# Patient Record
Sex: Female | Born: 1975 | Race: White | Hispanic: No | Marital: Married | Smoking: Never smoker
Health system: Southern US, Community
[De-identification: ages and names within clinical notes are randomized; demographics above are authoritative.]

## PROBLEM LIST (undated history)

## (undated) DIAGNOSIS — I471 Supraventricular tachycardia: Secondary | ICD-10-CM

## (undated) DIAGNOSIS — K219 Gastro-esophageal reflux disease without esophagitis: Secondary | ICD-10-CM

## (undated) DIAGNOSIS — E119 Type 2 diabetes mellitus without complications: Secondary | ICD-10-CM

## (undated) DIAGNOSIS — I428 Other cardiomyopathies: Secondary | ICD-10-CM

## (undated) DIAGNOSIS — E7800 Pure hypercholesterolemia, unspecified: Secondary | ICD-10-CM

## (undated) HISTORY — DX: Other cardiomyopathies: I42.8

---
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
## 1996-12-29 HISTORY — PX: LAPAROSCOPIC CHOLECYSTECTOMY: SUR755

## 2001-04-05 ENCOUNTER — Other Ambulatory Visit: Admission: RE | Admit: 2001-04-05 | Discharge: 2001-04-05 | Payer: Self-pay | Admitting: *Deleted

## 2002-03-06 ENCOUNTER — Emergency Department (HOSPITAL_COMMUNITY): Admission: EM | Admit: 2002-03-06 | Discharge: 2002-03-06 | Payer: Self-pay | Admitting: Emergency Medicine

## 2002-03-06 ENCOUNTER — Encounter: Payer: Self-pay | Admitting: Emergency Medicine

## 2005-10-21 ENCOUNTER — Ambulatory Visit (HOSPITAL_COMMUNITY): Admission: RE | Admit: 2005-10-21 | Discharge: 2005-10-21 | Payer: Self-pay | Admitting: *Deleted

## 2005-12-29 HISTORY — PX: TUBAL LIGATION: SHX77

## 2006-01-22 ENCOUNTER — Ambulatory Visit (HOSPITAL_COMMUNITY): Admission: RE | Admit: 2006-01-22 | Discharge: 2006-01-22 | Payer: Self-pay | Admitting: *Deleted

## 2006-03-10 ENCOUNTER — Inpatient Hospital Stay (HOSPITAL_COMMUNITY): Admission: RE | Admit: 2006-03-10 | Discharge: 2006-03-12 | Payer: Self-pay | Admitting: *Deleted

## 2006-03-10 ENCOUNTER — Encounter (INDEPENDENT_AMBULATORY_CARE_PROVIDER_SITE_OTHER): Payer: Self-pay | Admitting: *Deleted

## 2009-01-18 ENCOUNTER — Ambulatory Visit (HOSPITAL_COMMUNITY): Admission: RE | Admit: 2009-01-18 | Discharge: 2009-01-18 | Payer: Self-pay | Admitting: Obstetrics and Gynecology

## 2009-01-18 ENCOUNTER — Other Ambulatory Visit: Admission: RE | Admit: 2009-01-18 | Discharge: 2009-01-18 | Payer: Self-pay | Admitting: Internal Medicine

## 2011-05-16 NOTE — Discharge Summary (Signed)
NAMECARLISLE, TORGESON NO.:  [id number]   MEDICAL RECORD NO.:  [id number]          PATIENT TYPE:  INP   LOCATION:  A413                          FACILITY:  [facility]   PHYSICIAN:  Langley Gauss, MD     DATE OF BIRTH:  09-21-1976   DATE OF ADMISSION:  03/10/2006  DATE OF DISCHARGE:  03/15/2007LH                                 DISCHARGE SUMMARY   PROCEDURES:  1.  Repeat cesarean section.  2.  Intraoperative tubal ligation.   DISPOSITION:  The patient is given a copy of standard discharge  instructions.  Circumcision performed.   FOLLOW UP:  She will follow up in the office in 4 days' time for staple  removal.   CONDITION ON DISCHARGE:  At the time of discharge, JP is removed from the  subcutaneous space as well as retention sutures from the incision.  The  patient is breast-feeding.   LABORATORY DATA AND X-RAY FINDINGS:  Hemoglobin and hematocrit 12.8 and  37.3.  Postop day #1 hemoglobin and hematocrit 11.6/34.4.   DISCHARGE MEDICATIONS:  Tylox #40 without refill.   HOSPITAL COURSE:  At outpatient surgical unit on March 10, 2006, nonstress  test is performed and interpreted in a.m. of March 10, 2006.  The patient  was then taken to the operating room utilizing spinal analgesic.  Repeat  cesarean section and tubal ligation performed without complications.  Postoperatively, the patient did well.  Foley catheter was removed on postop  day #1.  The patient subsequently was ambulatory and able to void without  difficulty.  The patient's vital signs remained stable and she remained  afebrile.  She was able to advance her diet and postop day #2, she is  passing flatus and doing well with p.o. medications.  Examination of the  incision reveals it to be well-approximated and healing very well.  The  patient subsequently now discharged to home on March 12, 2006.      Langley Gauss, MD  Electronically Signed     DC/MEDQ  D:  03/12/2006  T:  03/13/2006  Job:   [id number]

## 2011-05-16 NOTE — Op Note (Signed)
Margaret [name], [name] NO.:  [id number]   MEDICAL RECORD NO.:  [id number]          PATIENT TYPE:  INP   LOCATION:  A413                          FACILITY:  [facility]   PHYSICIAN:  Langley Gauss, MD     DATE OF BIRTH:  01-11-1976   DATE OF PROCEDURE:  03/11/2006  DATE OF DISCHARGE:                                 OPERATIVE REPORT   PREOPERATIVE DIAGNOSES:  1.  A 39-week intrauterine pregnancy, previous low transverse cesarean      section.  2.  Desires permanent sterilization.   POSTOPERATIVE DIAGNOSES:  1.  A 39-week intrauterine pregnancy, previous low transverse cesarean      section.  2.  Desires permanent sterilization.   PROCEDURE:  Repeat low transverse cesarean section intraoperative tubal  ligation utilizing modified Pomeroy technique utilized by Dr. Langley Gauss.   COMPLICATIONS:  None.   SPECIMENS:  Portions of right left fallopian tubes to pass.  Suture in the  left.  Arterial cord gas and cord blood to laboratory. Placenta examined and  noted be apparently intact with a 3-vessel umbilical cord.   ANALGESIA:  Spinal estimated blood loss 800 cc.   SUMMARY:  A nonstress test interpretation performed. The patient presented  to [facility] per protocol in the a.m. of March 10, 2006 on labor  delivery suite.  Nonstress test is performed. Fetal heart rate baseline  noted be 150, accelerations noted to be greater than 15 beats per minute x15  seconds duration. Normal long-term variability. No fetal heart rate  decelerations noted. No uterine activity. The patient is then taken the  operating room. Spinal analgesic administered without complications, placed  on the OR table with a slight left lateral tilt. Foley catheter sterilely  placed to straight drainage. The patient sterilely prepped and draped after  assurance of adequate surgical anesthesia. A knife is used to excise the  previous Pfannenstiel incision. Bleeders were cauterized along  the way  dissected out to the fascial plane utilizing cautery, cauterizing bleeders.  The fascia is then incised in a transverse curvilinear manner while sharply  dissecting off the underlying rectus muscle. The fascial edges were then  grasped using straight Kocher clamps. Fascia was dissected off the  underlying rectus muscle both superiorly and inferiorly utilizing the Mayo  scissors.   The rectus muscle is then bluntly separated. Peritoneal cavity is  atraumatically and bluntly entered at the superior most portion of the  incision. Peritoneal incision is extended superiorly and inferiorly.  Inferiorly the bladder was palpated to avoid its accidental injury. Bladder  blade is then placed. The lower segment is identified. A bladder flap is  created in the vesicouterine fold utilizing Metzenbaum scissors. Palpation  revealed vertex presentation with a nonengaged vertex.  A knife was then  used to score a low transverse uterine incision and intact amniotic sac was  encountered in the midline. The index fingers are used to bluntly extend the  low transverse uterine incision bilaterally. Infant is noted be vertex but  in LOT position. My right hand reached into the uterine cavity. The head  of  the infant is flexed and elevated to the level of the uterine incision.   The Silastic suction connected to wall suction is then placed on the  infant's vertex.  A small amount of fundal pressure, in combination with  gentle traction, resulted in very easy delivery of the vertex through the  uterine incision without difficulty. The remainder of the infant also  delivered atraumatically. Mouth and nares bulb suctioned of clear amniotic  fluid. Umbilical cord is milked towards the infant. The cord is doubly  clamped and cut; and infant is handed to waiting pediatrician, Dr. Vivia Ewing. Arterial cord gas and cord blood are then obtained. Gentle traction on  the umbilical cord results in separation of  what appears to be an intact 3-  vessel placenta with associated umbilical cord. Intrauterine exploration  reveals no retained placenta fragments.   The uterus was then exteriorized. Uterine incision is not extended. Tubes  and ovaries noted be normal in appearance. Minimal adhesive disease is noted  to be present in the posterior cul-de-sac the uterine incision is then  easily closed in 2 layers of #0 chromic in a running locked fashion. The  second layer being an imbricating layer. This restores a normal anatomy and  results in excellent hemostasis. Tubes are then examined inside. The right  fallopian tube is identified. It is grasped in its midportion utilizing  Babcock clamp.  It is then elevated, at which time two ties of #1 plain  suture are placed at the base of the loop of tube formed. A knuckle of right  fallopian tube is then dissected free and handed off as specimen #1.  The  pedicles are dried. The left fallopian tube is likewise identified, elevated  in its midportion, two ties of #1 plain suture are placed at the base of the  loop of tube formed. A portion of left fallopian tube is then dissected with  the Metzenbaum scissors. A single piece of suture is placed through the left  fallopian tube to identify it from the right. Both pedicles were noted to be  dry with excellent hemostasis. After irrigation of the pelvic cavity, the  uterus is now, again, returned to the peritoneal cavity. Sponge, needle, and  instrument counts are correct x2 at this point. Hemostasis is noted both  from the fallopian tube ligation as well as from the uterine incision. The  peritoneal edges were then grasped using Kelly clamps. Peritoneum and  overlying rectus muscle are closed with a continuous running #1 chromic  suture to reapproximate the normal anatomy. No bleeders are identified. A  final irrigation of the pelvic cavity is performed to assure hemostasis. The peritoneum is then completely  closed utilizing a #1 chromic suture.   No subfascial bleeders are noted. A double-stranded #1 PDS suture is used to  reapproximate the fascia. Subcutaneous bleeders are then cauterized. A JP  drain is placed in the subcutaneous space with a separate exit wound to left  apex of the incision. This is sutured into place. Additional examination is  performed with no additional subcutaneous bleeders identified. Three  horizontal mattress sutures and #1 PDS are then placed as retention-type  sutures. This makes it very easy to completely staple the skin utilizing  skin staples. A total of 30 mL of 0.5% bupivacaine plain is then injected  along the entirety of the incision to facilitate our postoperative  analgesia. The patient tolerated the procedure very well. Vital signs have  remained stable.  She is taken to recovery room in stable condition.  Delivered is a 7 pound 3 ounce female infant, reportedly doing well and  nursery.      Langley Gauss, MD  Electronically Signed     DC/MEDQ  D:  03/10/2006  T:  03/11/2006  Job:  [id number]

## 2011-05-16 NOTE — H&P (Signed)
NAMEGUSTAVIA, CARIE NO.:  [id number]   MEDICAL RECORD NO.:  [id number]          PATIENT TYPE:  INP   LOCATION:  A413                          FACILITY:  [facility]   PHYSICIAN:  Langley Gauss, MD     DATE OF BIRTH:  November 14, 1976   DATE OF ADMISSION:  03/10/2006  DATE OF DISCHARGE:  LH                                HISTORY & PHYSICAL   A 35 year old gravida 3, para 2 at 83 to [redacted] weeks gestation who is admitted  for repeat low transverse Cesarean section and bilateral tubal ligation. The  patient has one prior vaginal delivery and one prior Cesarean section for  indication of breech presentation. She will now deliver by repeat Cesarean  section as VBAC delivery is not an option at [facility]. In  addition, the patient desires permanent and irreversible sterilization. She  does understand there are reversible forms of birth control available. She  had been counseled and signed the Medicaid 30-day waiting papers during the  prenatal course. Prenatal course is noted to be uncomplicated. She had  normal AFP triple screen. The remainder of labs within normal limits. GBS-  carrier status negative.   PAST MEDICAL HISTORY:  1.  Appendectomy in 2001.  2.  Laparoscopic cholecystectomy at age 29.  3.  Vaginal delivery in 1996.  4.  Cesarean section in 1999.   ALLERGIES:  CODEINE and SULFA. Does fine with oxycodone.   CURRENT MEDICATIONS:  Prenatal vitamins.   PHYSICAL EXAMINATION:  VITAL SIGNS:  Height 5 foot 3. Prepregnancy 212, most  recent 244. Blood pressure is 118/74, pulse 80, respiratory rate 20.  HEENT:  Negative. No adenopathy.  NECK:  Supple. Thyroid is not palpable.  LUNGS:  Clear.  CARDIOVASCULAR:  Regular rate and rhythm.  ABDOMEN:  Soft and nontender. Previous Pfannenstiel incision is noted. She  has vertex presentation by Leopold's maneuvers.  EXTREMITIES:  Normal.  PELVIC:  Normal external genitalia. Cervix noted to be closed. Fundal height  is  38 cm. Fetal heart tones auscultated in the 150s.   ASSESSMENT/PLAN:  The patient to be admitted for planned repeat low  transverse Cesarean section and permanent sterilization during the same  operative procedure. Risks and benefits discussed with the patient to  include increased blood loss associated with cesarean delivery.      Langley Gauss, MD  Electronically Signed     DC/MEDQ  D:  03/12/2006  T:  03/12/2006  Job:  [id number]

## 2015-04-27 ENCOUNTER — Encounter (HOSPITAL_COMMUNITY): Payer: Self-pay | Admitting: Emergency Medicine

## 2015-04-27 ENCOUNTER — Emergency Department (HOSPITAL_COMMUNITY)
Admission: EM | Admit: 2015-04-27 | Discharge: 2015-04-27 | Disposition: A | Discharge status: Home / Self Care | Payer: BLUE CROSS/BLUE SHIELD | Attending: Emergency Medicine | Admitting: Emergency Medicine

## 2015-04-27 ENCOUNTER — Emergency Department (HOSPITAL_COMMUNITY): Payer: BLUE CROSS/BLUE SHIELD

## 2015-04-27 DIAGNOSIS — R0602 Shortness of breath: Secondary | ICD-10-CM | POA: Diagnosis not present

## 2015-04-27 DIAGNOSIS — I471 Supraventricular tachycardia, unspecified: Secondary | ICD-10-CM

## 2015-04-27 DIAGNOSIS — R000 Tachycardia, unspecified: Secondary | ICD-10-CM | POA: Diagnosis present

## 2015-04-27 DIAGNOSIS — Z8659 Personal history of other mental and behavioral disorders: Secondary | ICD-10-CM | POA: Insufficient documentation

## 2015-04-27 DIAGNOSIS — R002 Palpitations: Secondary | ICD-10-CM

## 2015-04-27 LAB — CBC WITH DIFFERENTIAL/PLATELET
BASOS PCT: 1 % (ref 0–1)
Basophils Absolute: 0 10*3/uL (ref 0.0–0.1)
EOS ABS: 0.1 10*3/uL (ref 0.0–0.7)
Eosinophils Relative: 2 % (ref 0–5)
HCT: 39.9 % (ref 36.0–46.0)
Hemoglobin: 12.9 g/dL (ref 12.0–15.0)
LYMPHS PCT: 25 % (ref 12–46)
Lymphs Abs: 2 10*3/uL (ref 0.7–4.0)
MCH: 28.9 pg (ref 26.0–34.0)
MCHC: 32.3 g/dL (ref 30.0–36.0)
MCV: 89.3 fL (ref 78.0–100.0)
MONO ABS: 0.5 10*3/uL (ref 0.1–1.0)
MONOS PCT: 6 % (ref 3–12)
NEUTROS PCT: 66 % (ref 43–77)
Neutro Abs: 5.6 10*3/uL (ref 1.7–7.7)
Platelets: 195 10*3/uL (ref 150–400)
RBC: 4.47 MIL/uL (ref 3.87–5.11)
RDW: 13.1 % (ref 11.5–15.5)
WBC: 8.2 10*3/uL (ref 4.0–10.5)

## 2015-04-27 LAB — COMPREHENSIVE METABOLIC PANEL
ALBUMIN: 4 g/dL (ref 3.5–5.2)
ALK PHOS: 100 U/L (ref 39–117)
ALT: 14 U/L (ref 0–35)
ANION GAP: 6 (ref 5–15)
AST: 14 U/L (ref 0–37)
BILIRUBIN TOTAL: 0.5 mg/dL (ref 0.3–1.2)
BUN: 11 mg/dL (ref 6–23)
CO2: 27 mmol/L (ref 19–32)
CREATININE: 0.52 mg/dL (ref 0.50–1.10)
Calcium: 9 mg/dL (ref 8.4–10.5)
Chloride: 104 mmol/L (ref 96–112)
GLUCOSE: 86 mg/dL (ref 70–99)
Potassium: 3.6 mmol/L (ref 3.5–5.1)
SODIUM: 137 mmol/L (ref 135–145)
Total Protein: 6.9 g/dL (ref 6.0–8.3)

## 2015-04-27 MED ORDER — SODIUM CHLORIDE 0.9 % IV BOLUS (SEPSIS)
1000.0000 mL | Freq: Once | INTRAVENOUS | Status: AC
  Administered 2015-04-27: 1000 mL via INTRAVENOUS

## 2015-04-27 MED ORDER — SODIUM CHLORIDE 0.9 % IV SOLN
INTRAVENOUS | Status: DC
  Administered 2015-04-27: 17:00:00 via INTRAVENOUS

## 2015-04-27 NOTE — Discharge Instructions (Signed)
Return for recurrent fast heart rate that last of 40 minutes or longer. Return for any recurrence of shortness of breath. Return for any development of chest pain. Make appointment to follow-up with cardiology.

## 2015-04-27 NOTE — ED Notes (Addendum)
EMS called for chest pain and SVT.  EMS HR 145 on arrival.  Hr 104 in truck.  VSS.  #20 left hand.

## 2015-04-27 NOTE — ED Notes (Signed)
IV site positional, IVF infusing but at moderate rate.  No infliltration noted.

## 2015-04-27 NOTE — ED Provider Notes (Signed)
CSN: [id number]     Arrival date & time 04/27/15  1409 History   First MD Initiated Contact with Patient 04/27/15 1426     Chief Complaint  Patient presents with  . Tachycardia     (Consider location/radiation/quality/duration/timing/severity/associated sxs/prior Treatment) The history is provided by the patient.   patient brought in by EMS for palpitations and probable supraventricular tachycardia with heart rate around 145 get as high as 166. Patient was at work felt some palpitations. 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 EMS eventually got involved and patient was brought here. Patient was originally taken to urgent care by her husband and then EMS called from the urgent care. Associated with some mild mild shortness of breath no chest pain at all. Patient's had these feelings of fast heart rate in the past thought was due to anxiety. Never had a workup for. No syncope. No fevers. No nausea no vomiting. No significant pain at all. Patient was not given any medications by EMS on the way in. They started an IV.  History reviewed. No pertinent past medical history. Past Surgical History  Procedure Laterality Date  . Appendectomy    . Cholecystectomy    . Cesarean section      X2   Family History  Problem Relation Age of Onset  . Hypertension Mother   . Heart failure Father   . Hypertension Father   . Stroke Father    History  Substance Use Topics  . Smoking status: Never Smoker   . Smokeless tobacco: Not on file  . Alcohol Use: No   OB History    Gravida Para Term Preterm AB TAB SAB Ectopic Multiple Living   2 2 2             Review of Systems  Constitutional: Negative for fever and fatigue.  HENT: Negative for congestion.   Eyes: Negative for visual disturbance.  Respiratory: Positive for shortness of breath. Negative for cough and wheezing.   Cardiovascular: Positive for palpitations. Negative for chest pain and leg swelling.  Gastrointestinal:  Negative for nausea, vomiting and abdominal pain.  Genitourinary: Negative for dysuria.  Musculoskeletal: Negative for back pain.  Skin: Negative for rash.  Neurological: Negative for headaches.  Hematological: Does not bruise/bleed easily.  Psychiatric/Behavioral: Negative for confusion.      Allergies  Codeine and Sulfa antibiotics  Home Medications   Prior to Admission medications   Not on File   BP 110/62 mmHg  Pulse 97  Temp(Src) 98.3 F (36.8 C)  Resp 17  Ht 5\' 4"  (1.626 m)  Wt 201 lb (91.173 kg)  BMI 34.48 kg/m2  SpO2 100%  LMP 04/03/2015 Physical Exam  Constitutional: She is oriented to person, place, and time. She appears well-developed and well-nourished. No distress.  HENT:  Head: Normocephalic and atraumatic.  Eyes: Conjunctivae and EOM are normal. Pupils are equal, round, and reactive to light.  Neck: Normal range of motion.  Cardiovascular: Normal rate, regular rhythm and normal heart sounds.   No murmur heard. Pulmonary/Chest: Effort normal and breath sounds normal. No respiratory distress. She has no wheezes. She has no rales. She exhibits no tenderness.  Abdominal: Soft. Bowel sounds are normal. There is no tenderness.  Musculoskeletal: Normal range of motion. She exhibits no edema or tenderness.  Neurological: She is alert and oriented to person, place, and time. No cranial nerve deficit. She exhibits normal muscle tone. Coordination normal.  Skin: Skin is warm. No rash noted.  Nursing note and vitals reviewed.   ED Course  Procedures (including critical care time) Labs Review Labs Reviewed  COMPREHENSIVE METABOLIC PANEL  CBC WITH DIFFERENTIAL/PLATELET    Imaging Review Dg Chest 2 View  04/27/2015   CLINICAL DATA:  Cardiac palpitations  EXAM: CHEST  2 VIEW  COMPARISON:  None.  FINDINGS: Lungs are clear. Heart size and pulmonary vascularity are normal. No adenopathy. There is mild upper thoracic levoscoliosis.  IMPRESSION: No edema or  consolidation.   Electronically Signed   By: Lowella Grip III M.D.   On: 04/27/2015 15:13     EKG Interpretation   Date/Time:  Friday April 27 2015 14:10:27 EDT Ventricular Rate:  102 PR Interval:  121 QRS Duration: 93 QT Interval:  355 QTC Calculation: 462 R Axis:   11 Text Interpretation:  Sinus tachycardia Abnormal R-wave progression, early  transition Left ventricular hypertrophy Confirmed by Rogene Houston  MD, Nick Armel  [phone]) on 04/27/2015 2:19:56 PM      MDM   Final diagnoses:  Heart palpitations  SVT (supraventricular tachycardia)   Results for orders placed or performed during the hospital encounter of 04/27/15  Comprehensive metabolic panel  Result Value Ref Range   Sodium 137 135 - 145 mmol/L   Potassium 3.6 3.5 - 5.1 mmol/L   Chloride 104 96 - 112 mmol/L   CO2 27 19 - 32 mmol/L   Glucose, Bld 86 70 - 99 mg/dL   BUN 11 6 - 23 mg/dL   Creatinine, Ser 0.52 0.50 - 1.10 mg/dL   Calcium 9.0 8.4 - 10.5 mg/dL   Total Protein 6.9 6.0 - 8.3 g/dL   Albumin 4.0 3.5 - 5.2 g/dL   AST 14 0 - 37 U/L   ALT 14 0 - 35 U/L   Alkaline Phosphatase 100 39 - 117 U/L   Total Bilirubin 0.5 0.3 - 1.2 mg/dL   GFR calc non Af Amer >90 >90 mL/min   GFR calc Af Amer >90 >90 mL/min   Anion gap 6 5 - 15  CBC with Differential/Platelet  Result Value Ref Range   WBC 8.2 4.0 - 10.5 K/uL   RBC 4.47 3.87 - 5.11 MIL/uL   Hemoglobin 12.9 12.0 - 15.0 g/dL   HCT 39.9 36.0 - 46.0 %   MCV 89.3 78.0 - 100.0 fL   MCH 28.9 26.0 - 34.0 pg   MCHC 32.3 30.0 - 36.0 g/dL   RDW 13.1 11.5 - 15.5 %   Platelets 195 150 - 400 K/uL   Neutrophils Relative % 66 43 - 77 %   Neutro Abs 5.6 1.7 - 7.7 K/uL   Lymphocytes Relative 25 12 - 46 %   Lymphs Abs 2.0 0.7 - 4.0 K/uL   Monocytes Relative 6 3 - 12 %   Monocytes Absolute 0.5 0.1 - 1.0 K/uL   Eosinophils Relative 2 0 - 5 %   Eosinophils Absolute 0.1 0.0 - 0.7 K/uL   Basophils Relative 1 0 - 1 %   Basophils Absolute 0.0 0.0 - 0.1 K/uL     Patient  around 1045 noted palpitations. Was seen by the nurse at work noted that she had a fast heart rate.  As high as 166. Patient's had similar feelings in the past and thought that they were related to anxiety. EMS brought patient in no medication provided. Heart rate upon arrival was slightly over 100 peers associated with some mild shortness of breath no severe shortness of breath no chest pain.  Patient has not  had a workup for these palpitations in the past. Workup here today no acute findings on the EKG other than a sinus tachycardia with a heart rate of 102. Now patient without any formal treatment heart rate is in the 80s. Blood pressures been stable. Chest x-rays negative. Labs without significant abnormalities.  Patient's room air sats here then 95% or greater. Clinically no concern currently for pulmonary embolus. Silva Bandy this was just supraventricular tachycardia.  Patient stable for discharge home with precautions and follow-up with cardiology.   Fredia Sorrow, MD 04/27/15 [phone]

## 2015-05-14 ENCOUNTER — Ambulatory Visit (INDEPENDENT_AMBULATORY_CARE_PROVIDER_SITE_OTHER): Payer: BLUE CROSS/BLUE SHIELD | Admitting: Cardiology

## 2015-05-14 ENCOUNTER — Encounter: Payer: Self-pay | Admitting: Cardiology

## 2015-05-14 ENCOUNTER — Encounter: Payer: BLUE CROSS/BLUE SHIELD | Admitting: Adult Health

## 2015-05-14 VITALS — BP 132/86 | HR 90 | Ht 63.0 in | Wt 202.0 lb

## 2015-05-14 DIAGNOSIS — I471 Supraventricular tachycardia: Secondary | ICD-10-CM | POA: Diagnosis not present

## 2015-05-14 MED ORDER — METOPROLOL TARTRATE 25 MG PO TABS: 25.0000 mg | ORAL_TABLET | Freq: Two times a day (BID) | ORAL | Status: DC

## 2015-05-14 NOTE — Progress Notes (Signed)
     Clinical Summary Ms. Brathwaite is a 39 y.o.female seen today for follow up of the following medical problems.  1. Tachycardia - intermittent episodes of palpitations for 10 years - seen in ER 04/07/15 with palpitations, elevated heart rates at work to 150s. EMS strips show SVT rate 140-150s. - K 3.6, Cr 0.52, Hgb 12.9, CXR no acute proces - no coffee, pepsis x 3 cans, no tea, no energy drinks, no EtOH. No OTC supplements    No past medical history on file.   Allergies  Allergen Reactions  . Codeine Hives and Itching  . Sulfa Antibiotics Hives     No current outpatient prescriptions on file.   No current facility-administered medications for this visit.     Past Surgical History  Procedure Laterality Date  . Appendectomy    . Cholecystectomy    . Cesarean section      X2     Allergies  Allergen Reactions  . Codeine Hives and Itching  . Sulfa Antibiotics Hives      Family History  Problem Relation Age of Onset  . Hypertension Mother   . Heart failure Father   . Hypertension Father   . Stroke Father      Social History Ms. Flannery reports that she has never smoked. She does not have any smokeless tobacco history on file. Ms. Sawin reports that she does not drink alcohol.   Review of Systems CONSTITUTIONAL: No weight loss, fever, chills, weakness or fatigue.  HEENT: Eyes: No visual loss, blurred vision, double vision or yellow sclerae.No hearing loss, sneezing, congestion, runny nose or sore throat.  SKIN: No rash or itching.  CARDIOVASCULAR: per HPI RESPIRATORY: No shortness of breath, cough or sputum.  GASTROINTESTINAL: No anorexia, nausea, vomiting or diarrhea. No abdominal pain or blood.  GENITOURINARY: No burning on urination, no polyuria NEUROLOGICAL: No headache, dizziness, syncope, paralysis, ataxia, numbness or tingling in the extremities. No change in bowel or bladder control.  MUSCULOSKELETAL: No muscle, back pain, joint pain or  stiffness.  LYMPHATICS: No enlarged nodes. No history of splenectomy.  PSYCHIATRIC: No history of depression or anxiety.  ENDOCRINOLOGIC: No reports of sweating, cold or heat intolerance. No polyuria or polydipsia.  Marland Kitchen   Physical Examination Filed Vitals:   05/14/15 1316  BP: 132/86  Pulse: 90   Filed Vitals:   05/14/15 1316  Height: 5\' 3"  (1.6 m)  Weight: 202 lb (91.627 kg)    Gen: resting comfortably, no acute distress HEENT: no scleral icterus, pupils equal round and reactive, no palptable cervical adenopathy,  CV: RRR, no m/r/g, no JVD, no carotid bruits Resp: Clear to auscultation bilaterally GI: abdomen is soft, non-tender, non-distended, normal bowel sounds, no hepatosplenomegaly MSK: extremities are warm, no edema.  Skin: warm, no rash Neuro:  no focal deficits Psych: appropriate affect     Assessment and Plan  1. PSVT - will check Mg and TSH as these were not checked in ER - counseled to limit caffeine intake - will start lopressor 25mg  bid      Arnoldo Lenis, M.D.

## 2015-05-14 NOTE — Patient Instructions (Signed)
Your physician wants you to follow-up in: 6 months with Dr Bryna Colander will receive a reminder letter in the mail two months in advance. If you don't receive a letter, please call our office to schedule the follow-up appointment.     START Lopressor 25 mg twice a day    Please get lab work : BMET,TSH,Magnesium     Thank you for choosing [facility] !

## 2015-05-14 NOTE — Progress Notes (Signed)
Cardiology Office Note   ERROR NO SHOW 

## 2015-05-15 LAB — MAGNESIUM: Magnesium: 2 mg/dL (ref 1.5–2.5)

## 2015-05-15 LAB — BASIC METABOLIC PANEL
BUN: 10 mg/dL (ref 6–23)
CO2: 28 mEq/L (ref 19–32)
Calcium: 9.2 mg/dL (ref 8.4–10.5)
Chloride: 103 mEq/L (ref 96–112)
Creat: 0.7 mg/dL (ref 0.50–1.10)
Glucose, Bld: 101 mg/dL — ABNORMAL HIGH (ref 70–99)
POTASSIUM: 3.5 meq/L (ref 3.5–5.3)
Sodium: 139 mEq/L (ref 135–145)

## 2015-05-15 LAB — TSH: TSH: 0.773 u[IU]/mL (ref 0.350–4.500)

## 2015-06-06 ENCOUNTER — Telehealth: Payer: Self-pay | Admitting: Cardiology

## 2015-06-06 NOTE — Telephone Encounter (Signed)
Patient would like to speak with nurse regarding medication concerns. / tg  °

## 2015-06-06 NOTE — Telephone Encounter (Signed)
Patient states that she at 8:30 am this morning she started to feel dizzy, SOB and have a cold sweats on and off. Patient denies CP at this time. Patient encouraged to be seen in the ED today.

## 2015-06-29 ENCOUNTER — Ambulatory Visit: Payer: BLUE CROSS/BLUE SHIELD | Admitting: Cardiology

## 2015-10-23 ENCOUNTER — Emergency Department (HOSPITAL_COMMUNITY)
Admission: EM | Admit: 2015-10-23 | Discharge: 2015-10-23 | Disposition: A | Discharge status: Home / Self Care | Payer: BLUE CROSS/BLUE SHIELD | Attending: Emergency Medicine | Admitting: Emergency Medicine

## 2015-10-23 ENCOUNTER — Encounter (HOSPITAL_COMMUNITY): Payer: Self-pay | Admitting: *Deleted

## 2015-10-23 DIAGNOSIS — Z79899 Other long term (current) drug therapy: Secondary | ICD-10-CM | POA: Diagnosis not present

## 2015-10-23 DIAGNOSIS — R0602 Shortness of breath: Secondary | ICD-10-CM | POA: Diagnosis present

## 2015-10-23 DIAGNOSIS — I471 Supraventricular tachycardia: Secondary | ICD-10-CM | POA: Diagnosis not present

## 2015-10-23 HISTORY — DX: Supraventricular tachycardia: I47.1

## 2015-10-23 MED ORDER — OXYCODONE-ACETAMINOPHEN 5-325 MG PO TABS: 2.0000 | ORAL_TABLET | ORAL | Status: DC | PRN

## 2015-10-23 MED ORDER — IBUPROFEN 800 MG PO TABS: 800.0000 mg | ORAL_TABLET | Freq: Three times a day (TID) | ORAL | Status: DC

## 2015-10-23 MED ORDER — ADENOSINE 6 MG/2ML IV SOLN
6.0000 mg | Freq: Once | INTRAVENOUS | Status: AC
  Administered 2015-10-23: 6 mg via INTRAVENOUS

## 2015-10-23 MED ORDER — ADENOSINE 6 MG/2ML IV SOLN
INTRAVENOUS | Status: AC
  Administered 2015-10-23: 6 mg via INTRAVENOUS
  Filled 2015-10-23: qty 6

## 2015-10-23 MED ORDER — ONDANSETRON 4 MG PO TBDP: 4.0000 mg | ORAL_TABLET | Freq: Three times a day (TID) | ORAL | Status: DC | PRN

## 2015-10-23 MED ORDER — METOPROLOL TARTRATE 1 MG/ML IV SOLN
5.0000 mg | Freq: Once | INTRAVENOUS | Status: AC
  Administered 2015-10-23: 5 mg via INTRAVENOUS

## 2015-10-23 MED ORDER — METOPROLOL TARTRATE 1 MG/ML IV SOLN
INTRAVENOUS | Status: AC
  Filled 2015-10-23: qty 5

## 2015-10-23 NOTE — ED Notes (Addendum)
Pt was brought in by [facility] from pt's work. EMS reports pt is in asymptomatic SVT. Pt was c/o SOB. BP 112/86, HR 162, O2 sat 99% RA. Pt has hx of SVT in May and was placed on Lopressor. EMS reports Fire attempted vagal maneuvers with pt with no help. Pt reports she started feeling SOB and her "heart pounding" about 0830 this morning at work and went to see nurse at work. Pt denies chest pain, nausea, vomiting, dizziness, lightheadedness.

## 2015-10-23 NOTE — ED Notes (Signed)
MD at bedside. 

## 2015-10-23 NOTE — ED Provider Notes (Addendum)
CSN: [id number]     Arrival date & time 10/23/15  1100 History  By signing my name below, I, Emmanuella Mensah, attest that this documentation has been prepared under the direction and in the presence of Tanna Furry, MD. Electronically Signed: Judithann Sauger, ED Scribe. 10/23/2015. 11:27 AM.     Chief Complaint  Patient presents with  . Tachycardia  . Shortness of Breath    The history is provided by the patient. No language interpreter was used.   HPI Comments: Margaret [name] is a 39 y.o. female with a hx of SVT who presents to the Emergency Department complaining of gradually worsening tachycardia and SOB onset 3 hours ago. She explains that her HR was 160 went the nurse checked it at work. Pt is currently on Lopressor twice a day and her last dose was this am. She reports that she was diagnosed with SVT in April and has only had mild episodes between then and now. She states that she has not been compliant with her no caffeine diet and she had about 1/4 of a can of Pepsi PTA. She denies an hx of smoking, drinking, or drug use.   Past Medical History  Diagnosis Date  . SVT (supraventricular tachycardia) [facility])    Past Surgical History  Procedure Laterality Date  . Appendectomy    . Cholecystectomy    . Cesarean section      X2   Family History  Problem Relation Age of Onset  . Hypertension Mother   . Heart failure Father   . Hypertension Father   . Stroke Father    Social History  Substance Use Topics  . Smoking status: Never Smoker   . Smokeless tobacco: None  . Alcohol Use: No   OB History    Gravida Para Term Preterm AB TAB SAB Ectopic Multiple Living   2 2 2             Review of Systems  Constitutional: Negative for fever, chills, diaphoresis, appetite change and fatigue.  HENT: Negative for mouth sores, sore throat and trouble swallowing.   Eyes: Negative for visual disturbance.  Respiratory: Positive for shortness of breath. Negative for cough, chest  tightness and wheezing.   Cardiovascular: Negative for chest pain.       Tachycardia  Gastrointestinal: Negative for nausea, vomiting, abdominal pain, diarrhea and abdominal distention.  Endocrine: Negative for polydipsia, polyphagia and polyuria.  Genitourinary: Negative for dysuria, frequency and hematuria.  Musculoskeletal: Negative for gait problem.  Skin: Negative for color change, pallor and rash.  Neurological: Negative for dizziness, syncope, light-headedness and headaches.  Hematological: Does not bruise/bleed easily.  Psychiatric/Behavioral: Negative for behavioral problems and confusion.      Allergies  Codeine and Sulfa antibiotics  Home Medications   Prior to Admission medications   Medication Sig Start Date End Date Taking? Authorizing Provider  metoprolol tartrate (LOPRESSOR) 25 MG tablet Take 1 tablet (25 mg total) by mouth 2 (two) times daily. 05/14/15  Yes Arnoldo Lenis, MD   BP 119/74 mmHg  Pulse 81  Temp(Src) 97.5 F (36.4 C) (Oral)  Resp 16  Ht 5\' 4"  (1.626 m)  Wt 218 lb (98.884 kg)  BMI 37.40 kg/m2  SpO2 97%  LMP 10/09/2015 (Exact Date) Physical Exam  Constitutional: She is oriented to person, place, and time. She appears well-developed and well-nourished. No distress.  HENT:  Head: Normocephalic.  Eyes: Conjunctivae are normal. Pupils are equal, round, and reactive to light. No scleral icterus.  Neck: Normal range of motion. Neck supple. No thyromegaly present.  Cardiovascular: Regular rhythm.  Exam reveals no gallop and no friction rub.   No murmur heard. Tachycardia  Pulmonary/Chest: Effort normal and breath sounds normal. No respiratory distress. She has no wheezes. She has no rales.  Abdominal: Soft. Bowel sounds are normal. She exhibits no distension. There is no tenderness. There is no rebound.  Musculoskeletal: Normal range of motion.  Neurological: She is alert and oriented to person, place, and time.  Skin: Skin is warm and dry. No  rash noted.  Psychiatric: She has a normal mood and affect. Her behavior is normal.  Nursing note and vitals reviewed.   ED Course  .Cardioversion Date/Time: 10/23/2015 11:20 AM Performed by: Tanna Furry Authorized by: Tanna Furry Consent: Verbal consent obtained. Risks and benefits: risks, benefits and alternatives were discussed Consent given by: patient Patient understanding: patient states understanding of the procedure being performed Patient identity confirmed: verbally with patient Time out: Immediately prior to procedure a "time out" was called to verify the correct patient, procedure, equipment, support staff and site/side marked as required. Patient sedated: no Cardioversion basis: emergent Pre-procedure rhythm: supraventricular tachycardia Number of attempts: 1   Patient given Identicard 6 mg IV push with immediate conversion to sinus rhythm.   (including critical care time) DIAGNOSTIC STUDIES: Oxygen Saturation is 99% on RA, normal by my interpretation.    COORDINATION OF CARE: 11:24 AM- Pt advised of plan for treatment and pt agrees. Pt will be monitored and given adenosine to evaluate possible SVT.    Labs Review Labs Reviewed - No data to display  Imaging Review No results found. No att. providers found has personally reviewed and evaluated these images and lab results as part of his medical decision-making.   EKG Interpretation   Date/Time:  Tuesday October 23 2015 11:12:44 EDT Ventricular Rate:  134 PR Interval:    QRS Duration: 91 QT Interval:  316 QTC Calculation: 472 R Axis:   24 Text Interpretation:  PSVT. --Converts to NSR with Adenosine 6mg  IVP  Confirmed by Jeneen Rinks  MD, [place] ([id number]) on 10/23/2015 3:26:42 PM      MDM   Final diagnoses:  Supraventricular tachycardia ([place])    Patient underwent chemical cardioversion from SVT to sinus rhythm with Identicard 6 mg. Given Lopressor 5 mg. Maintain sinus rhythm. Plan is home, absolute abstinence  from caffeine, cardiology follow up Dr. branch to discuss further care possibility of procedures or ablation. ER with recurrence.  I personally performed the services described in this documentation, which was scribed in my presence. The recorded information has been reviewed and is accurate.   Tanna Furry, MD 10/23/15 [place], MD 10/23/15 [phone]

## 2015-10-23 NOTE — Discharge Instructions (Signed)
Stop caffeine. Call Dr. Harl Bowie for recheck. Return to ER with recurrence   Paroxysmal Supraventricular Tachycardia Paroxysmal supraventricular tachycardia (PSVT) is a type of abnormal heart rhythm. It causes your heart to beat very quickly and then suddenly stop beating so quickly. A normal heart rate is 60-100 beats per minute. During an episode of PSVT, your heart rate may be 150-250 beats per minute. This can make you feel light-headed and short of breath. An episode of PSVT can be frightening. It is usually not dangerous. The heart has four chambers. All chambers need to work together for the heart to beat effectively. A normal heartbeat usually starts in the right upper chamber of the heart (atrium) when an area (sinoatrial node) puts out an electrical signal that spreads to the other chambers. People with PSVT may have abnormal electrical pathways, or they may have other areas in the upper chambers that send out electrical signals. The result is a very rapid heartbeat. When your heart beats very quickly, it does not have time to fill completely with blood. When PSVT happens often or it lasts for long periods, it can lead to heart weakness and failure. Most people with PSVT do not have any other heart disease. CAUSES Abnormal electrical activity in the heart causes PSVT. It is not known why some people get PSVT and others do not. RISK FACTORS You may be more likely to have PSVT if:  You are 75-59 years old.  You are a woman. Other factors that may increase your chances of an attack include:  Stress.  Being tired.  Smoking.  Stimulant drugs.  Alcoholic drinks.  Caffeine.  Pregnancy. SIGNS AND SYMPTOMS A mild episode of PSVT may cause no symptoms. If you do have signs and symptoms, they may include:  A pounding heart.  Feeling of skipped heartbeats (palpitations).  Weakness.  Shortness of breath.  Tightness or pain in your  chest.  Light-headedness.  Anxiety.  Dizziness.  Sweating.  Nausea.  A fainting spell. DIAGNOSIS Your health care provider may suspect PSVT if you have symptoms that come and go. The health care provider will do a physical exam. If you are having an episode during the exam, the health care provider may be able to diagnose PSVT by listening to your heart and feeling your pulse. Tests may also be done, including:  An electrical study of your heart (electrocardiogram, or ECG).  A test in which you wear a portable ECG monitor all day (Holter monitor) or for several days (event monitor).  A test that involves taking an image of your heart using sound waves (echocardiogram) to rule out other causes of a fast heart rate. TREATMENT You may not need treatment if episodes of PSVT do not happen often or if they do not cause symptoms. If PSVT episodes do cause symptoms, your health care provider may first suggest trying a self-treatment called vagus nerve stimulation. The vagus nerve extends down from the brain. It regulates certain body functions. Stimulating this nerve can slow down the heart. Your health care provider can teach you ways to do this. You may need to try a few ways to find what works best for you. Options include:  Holding your breath and pushing, as though you are having a bowel movement.  Massaging an area on one side of your neck below your jaw.  Bending forward with your head between your legs.  Bending forward with your head between your legs and coughing.  Massaging your eyeballs with your  eyes closed. If vagus nerve stimulation does not work, other treatment options include:  Medicines to prevent an attack.  Being treated in the hospital with medicine or electric shock to stop an attack (cardioversion). This treatment can include:  Getting medicine through an IV line.  Having a small electric shock delivered to your heart. You will be given medicine to make you  sleep through this procedure.  If you have frequent episodes with symptoms, you may need a procedure to get rid of the faulty areas of your heart (radiofrequency ablation) and end the episodes of PSVT. In this procedure:  A long, thin tube (catheter) is passed through one of your veins into your heart.  Energy directed through the catheter eliminates the areas of your heart that are causing abnormal electric stimulation. HOME CARE INSTRUCTIONS  Take medicines only as directed by your health care provider.  Do not use caffeine in any form if caffeine triggers episodes of PSVT. Otherwise, consume caffeine in moderation. This means no more than a few cups of coffee or the equivalent each day.  Do not drink alcohol if alcohol triggers episodes of PSVT. Otherwise, limit alcohol intake to no more than 1 drink per day for nonpregnant women and 2 drinks per day for men. One drink equals 12 ounces of beer, 5 ounces of wine, or 1 ounces of hard liquor.  Do not use any tobacco products, including cigarettes, chewing tobacco, or electronic cigarettes. If you need help quitting, ask your health care provider.  Try to get at least 7 hours of sleep each night.  Find healthy ways to manage stress.  Perform vagus nerve stimulation as directed by your health care provider.  Maintain a healthy weight.  Get some exercise on most days. Ask your health care provider to suggest some good activities for you. SEEK MEDICAL CARE IF:  You are having episodes of PSVT more often, or they are lasting longer.  Vagus nerve stimulation is no longer helping.  You have new symptoms during an episode. SEEK IMMEDIATE MEDICAL CARE IF:  You have chest pain or trouble breathing.  You have an episode of PSVT that has lasted longer than 20 minutes.  You have passed out from an episode of PSVT. These symptoms may represent a serious problem that is an emergency. Do not wait to see if the symptoms will go away. Get  medical help right away. Call your local emergency services (911 in the [place] Do not drive yourself to the hospital.   This information is not intended to replace advice given to you by your health care provider. Make sure you discuss any questions you have with your health care provider.   Document Released: 12/15/2005 Document Revised: 01/05/2015 Document Reviewed: 05/25/2014 Elsevier Interactive Patient Education [employer].

## 2015-10-24 ENCOUNTER — Telehealth: Payer: Self-pay | Admitting: Cardiology

## 2015-10-24 NOTE — Telephone Encounter (Signed)
Would like to know if she needs to be seen sooner than her November appt.  She stated that she was in ED at [facility] 10/23/15

## 2015-10-24 NOTE — Telephone Encounter (Signed)
Patient states she was seen for SVT and SOB  at [facility]. Will forward to Dr. Harl Bowie

## 2015-10-25 MED ORDER — METOPROLOL TARTRATE 25 MG PO TABS: 37.5000 mg | ORAL_TABLET | Freq: Two times a day (BID) | ORAL | Status: DC

## 2015-10-25 NOTE — Telephone Encounter (Signed)
Called pt back to let her know about medication change & to keep her scheduled appointment with Dr. Harl Bowie.

## 2015-10-25 NOTE — Telephone Encounter (Signed)
Spoke to pt- Let her know to increase lopressor to 37.5 mg twice daily. Keep same apt.

## 2015-10-25 NOTE — Telephone Encounter (Signed)
ER notes reviewed. I would increase her lopressor to 37.5mg  bid and keep same appointment unless continued symptoms.   Zandra Abts MD

## 2015-11-13 ENCOUNTER — Encounter: Payer: Self-pay | Admitting: Cardiology

## 2015-11-13 ENCOUNTER — Ambulatory Visit (INDEPENDENT_AMBULATORY_CARE_PROVIDER_SITE_OTHER): Payer: BLUE CROSS/BLUE SHIELD | Admitting: Cardiology

## 2015-11-13 VITALS — BP 120/78 | HR 80 | Ht 63.0 in | Wt 225.0 lb

## 2015-11-13 DIAGNOSIS — I471 Supraventricular tachycardia, unspecified: Secondary | ICD-10-CM

## 2015-11-13 DIAGNOSIS — R002 Palpitations: Secondary | ICD-10-CM

## 2015-11-13 MED ORDER — METOPROLOL TARTRATE 50 MG PO TABS: 50.0000 mg | ORAL_TABLET | Freq: Two times a day (BID) | ORAL | Status: DC

## 2015-11-13 NOTE — Patient Instructions (Signed)
Medication Instructions:  Increase lopressor to 50 mg TWICE DAILY  Labwork: NONE  Testing/Procedures: NONE  Follow-Up: Your physician recommends that you schedule a follow-up appointment in:3 MONTHS WITH DR. BRANCH  Any Other Special Instructions Will Be Listed Below (If Applicable).     If you need a refill on your cardiac medications before your next appointment, please call your pharmacy.  Thanks for choosing [facility]!!!

## 2015-11-13 NOTE — Progress Notes (Signed)
Patient ID: Margaret [name], female   DOB: 16-Jan-1976, 39 y.o.   MRN: [id number]     Clinical Summary Ms. Margaret [name] is a 39 y.o.female seen today for follow up of the following medical problems.   1. PSVT - intermittent episodes of palpitations for 10 years - seen in ER 04/07/15 with palpitations, elevated heart rates at work to 150s. EMS strips show SVT rate 140-150s. - K 3.6, Cr 0.52, Hgb 12.9, CXR no acute proces - repeat ER visit 09/2015 with palpitations and SOB. HR 160 at work. Converted with adenosine in ER. EKG shows SVT.  - she is limiting her caffeine intake - still with some occasional palpitations.      Past Medical History  Diagnosis Date  . SVT (supraventricular tachycardia) (HCC)      Allergies  Allergen Reactions  . Codeine Hives and Itching  . Sulfa Antibiotics Hives     Current Outpatient Prescriptions  Medication Sig Dispense Refill  . metoprolol tartrate (LOPRESSOR) 25 MG tablet Take 1.5 tablets (37.5 mg total) by mouth 2 (two) times daily. 180 tablet 3   No current facility-administered medications for this visit.     Past Surgical History  Procedure Laterality Date  . Appendectomy    . Cholecystectomy    . Cesarean section      X2     Allergies  Allergen Reactions  . Codeine Hives and Itching  . Sulfa Antibiotics Hives      Family History  Problem Relation Age of Onset  . Hypertension Mother   . Heart failure Father   . Hypertension Father   . Stroke Father      Social History Ms. Margaret [name] reports that she has never smoked. She does not have any smokeless tobacco history on file. Ms. Margaret [name] reports that she does not drink alcohol.   Review of Systems CONSTITUTIONAL: No weight loss, fever, chills, weakness or fatigue.  HEENT: Eyes: No visual loss, blurred vision, double vision or yellow sclerae.No hearing loss, sneezing, congestion, runny nose or sore throat.  SKIN: No rash or itching.  CARDIOVASCULAR: per HPI RESPIRATORY: No  shortness of breath, cough or sputum.  GASTROINTESTINAL: No anorexia, nausea, vomiting or diarrhea. No abdominal pain or blood.  GENITOURINARY: No burning on urination, no polyuria NEUROLOGICAL: No headache, dizziness, syncope, paralysis, ataxia, numbness or tingling in the extremities. No change in bowel or bladder control.  MUSCULOSKELETAL: No muscle, back pain, joint pain or stiffness.  LYMPHATICS: No enlarged nodes. No history of splenectomy.  PSYCHIATRIC: No history of depression or anxiety.  ENDOCRINOLOGIC: No reports of sweating, cold or heat intolerance. No polyuria or polydipsia.  Marland Kitchen   Physical Examination Filed Vitals:   11/13/15 1055  BP: 120/78  Pulse: 80   Filed Vitals:   11/13/15 1055  Height: 5\' 3"  (1.6 m)  Weight: 225 lb (102.059 kg)    Gen: resting comfortably, no acute distress HEENT: no scleral icterus, pupils equal round and reactive, no palptable cervical adenopathy,  CV Resp: Clear to auscultation bilaterally GI: abdomen is soft, non-tender, non-distended, normal bowel sounds, no hepatosplenomegaly MSK: extremities are warm, no edema.  Skin: warm, no rash Neuro:  no focal deficits Psych: appropriate affect    Assessment and Plan  1. PSVT - still with ongoing symptoms. Will increase lopressor to 50mg  bid - f/u 3 months       Arnoldo Lenis, M.D.

## 2015-12-12 ENCOUNTER — Telehealth: Payer: Self-pay | Admitting: Cardiology

## 2015-12-12 ENCOUNTER — Encounter: Payer: Self-pay | Admitting: *Deleted

## 2015-12-12 MED ORDER — METOPROLOL TARTRATE 50 MG PO TABS: 75.0000 mg | ORAL_TABLET | Freq: Two times a day (BID) | ORAL | Status: DC

## 2015-12-12 NOTE — Telephone Encounter (Signed)
I would increase her lopressor to 75mg  bid, as I think her symptoms are related to her arrhythmia and this should help. Please refer her to Dr Lovena Le earliest available to discuss possible ablation procedure. Continue to limit caffeine and alcholol   Margaret Abts MD

## 2015-12-12 NOTE — Telephone Encounter (Signed)
Called patient. No answer. Left message to call back. Orders placed

## 2015-12-12 NOTE — Telephone Encounter (Signed)
By chest pains does she mean the palpitations or a different kind of symptom? Has the higher dose of metoprolol helped at all?  Does she mean scheduling an ablation procedure for her arrhythmia? If she is interested we would first need to get her to see one of our EP docs. Please get some more info and message me back   J Andelyn Spade MD

## 2015-12-12 NOTE — Telephone Encounter (Signed)
Patient reports pain as a pressure states that it is harder than a palpitation. When asked did they feel like a thump she said yes. Patient states that after feeling the pain she becomes SOB. It takes her about 15 mins to recover. Patient reports this happening 2-3 times a week. She would like to have ablation done.

## 2015-12-12 NOTE — Telephone Encounter (Signed)
Pt states the metoprolol is making her have chest pains, pt is wanting to talk w/ Dr. Harl Bowie about scheduling her procedure. Pt is at work at this time, she said she will be checking her voicemail

## 2015-12-12 NOTE — Telephone Encounter (Signed)
Spoke with patient. Orders explained. Patient voiced understanding.

## 2015-12-12 NOTE — Telephone Encounter (Signed)
Left message to call back  

## 2015-12-12 NOTE — Telephone Encounter (Signed)
WILL FORWARD TO DR. BRANCH

## 2015-12-19 ENCOUNTER — Institutional Professional Consult (permissible substitution): Payer: BLUE CROSS/BLUE SHIELD | Admitting: Internal Medicine

## 2015-12-19 ENCOUNTER — Encounter: Payer: Self-pay | Admitting: Internal Medicine

## 2015-12-19 ENCOUNTER — Ambulatory Visit (INDEPENDENT_AMBULATORY_CARE_PROVIDER_SITE_OTHER): Payer: BLUE CROSS/BLUE SHIELD | Admitting: Internal Medicine

## 2015-12-19 VITALS — BP 120/90 | HR 79 | Ht 63.0 in | Wt 226.8 lb

## 2015-12-19 DIAGNOSIS — I471 Supraventricular tachycardia: Secondary | ICD-10-CM

## 2015-12-19 NOTE — Progress Notes (Signed)
      HPI Margaret [name] is referred today by Dr. Harl Bowie for evaluation of SVT. She is a pleasant 39 yo woman with a h/o tachypalpitations who has had recurrent SVT at 140/min. She has been treated with IV adenosine. The patient was placed on metoprolol which has caused fatigue and chest pressure. She denies syncope with her SVT but does note sob.  Allergies  Allergen Reactions  . Codeine Hives and Itching  . Sulfa Antibiotics Hives     Current Outpatient Prescriptions  Medication Sig Dispense Refill  . metoprolol (LOPRESSOR) 50 MG tablet Take 1.5 tablets (75 mg total) by mouth 2 (two) times daily. 90 tablet 6   No current facility-administered medications for this visit.     Past Medical History  Diagnosis Date  . SVT (supraventricular tachycardia) (HCC)     ROS:   All systems reviewed and negative except as noted in the HPI.   Past Surgical History  Procedure Laterality Date  . Appendectomy    . Cholecystectomy    . Cesarean section      X2     Family History  Problem Relation Age of Onset  . Hypertension Mother   . Heart failure Father   . Hypertension Father   . Stroke Father      Social History   Social History  . Marital Status: Married    Spouse Name: N/A  . Number of Children: N/A  . Years of Education: N/A   Occupational History  . Not on file.   Social History Main Topics  . Smoking status: Never Smoker   . Smokeless tobacco: Not on file  . Alcohol Use: No  . Drug Use: No  . Sexual Activity: Yes   Other Topics Concern  . Not on file   Social History Narrative     BP 120/90 mmHg  Pulse 79  Ht 5\' 3"  (1.6 m)  Wt 226 lb 12.8 oz (102.876 kg)  BMI 40.19 kg/m2  SpO2 97%  Physical Exam:  Stable appearing 39 yo woman, NAD HEENT: Unremarkable Neck:  6 cm JVD, no thyromegally Lymphatics:  No adenopathy Back:  No CVA tenderness Lungs:  Clear with no wheezes rales or rhonchi HEART:  Regular rate rhythm, no murmurs, no rubs, no  clicks Abd:  soft, positive bowel sounds, no organomegally, no rebound, no guarding Ext:  2 plus pulses, no edema, no cyanosis, no clubbing Skin:  No rashes no nodules Neuro:  CN II through XII intact, motor grossly intact  EKG - nsr with no pre-excitation.  Assess/Plan:

## 2015-12-19 NOTE — Patient Instructions (Signed)
Medication Instructions:  Your physician recommends that you continue on your current medications as directed. Please refer to the Current Medication list given to you today.   Labwork: None ordered   Testing/Procedures: Your physician has recommended that you have an ablation. Catheter ablation is a medical procedure used to treat some cardiac arrhythmias (irregular heartbeats). During catheter ablation, a long, thin, flexible tube is put into a blood vessel in your groin (upper thigh), or neck. This tube is called an ablation catheter. It is then guided to your heart through the blood vessel. Radio frequency waves destroy small areas of heart tissue where abnormal heartbeats may cause an arrhythmia to start. Please see the instruction sheet given to you today.--- dates to look at for procedure are 1/11, 1/16,1/20, 1/24, 1/30,2/2,2/7, 2/8, 2/14, 2/15, 2/20,     Follow-Up: Your physician recommends that you schedule a follow-up appointment to be scheduled after procedure   Any Other Special Instructions Will Be Listed Below (If Applicable).     If you need a refill on your cardiac medications before your next appointment, please call your pharmacy.

## 2015-12-19 NOTE — Assessment & Plan Note (Signed)
The patient has symptomatic SVT which is sensitive to adenosine. I have discussed the treatment options with the patient and she will call us she would like to proceed with SVT ablation. The risks/benefits/goals/expectations of the procedure were discussed and she is reflecting.

## 2015-12-21 ENCOUNTER — Telehealth: Payer: Self-pay | Admitting: Internal Medicine

## 2015-12-21 DIAGNOSIS — I471 Supraventricular tachycardia: Secondary | ICD-10-CM

## 2015-12-21 NOTE — Telephone Encounter (Signed)
New Message    Pt is calling because she said she is scheduled for surgery for   Dr.Taylor and she needs the RN to call her back to give instructions  She feels the need to also inform RN that she has not came on her period yet.

## 2015-12-21 NOTE — Telephone Encounter (Signed)
Returned call to patient and will schedule SVT ablation for 01/09/16 at 12:00

## 2015-12-21 NOTE — Telephone Encounter (Signed)
Spoke with patient and she request her labs be done on 01/02/16 at 2pm

## 2015-12-21 NOTE — Telephone Encounter (Signed)
Follow Up  Pt request a call back to discuss rescheduling blood work for the SVT. Nothing is scheduled and no order on file. Please assist

## 2015-12-26 ENCOUNTER — Encounter: Payer: Self-pay | Admitting: *Deleted

## 2016-01-02 ENCOUNTER — Other Ambulatory Visit (INDEPENDENT_AMBULATORY_CARE_PROVIDER_SITE_OTHER): Payer: BLUE CROSS/BLUE SHIELD

## 2016-01-02 DIAGNOSIS — I471 Supraventricular tachycardia: Secondary | ICD-10-CM

## 2016-01-02 LAB — CBC WITH DIFFERENTIAL/PLATELET
Basophils Absolute: 0 10*3/uL (ref 0.0–0.1)
Basophils Relative: 0 % (ref 0–1)
EOS ABS: 0.1 10*3/uL (ref 0.0–0.7)
Eosinophils Relative: 1 % (ref 0–5)
HCT: 35.9 % — ABNORMAL LOW (ref 36.0–46.0)
Hemoglobin: 12.3 g/dL (ref 12.0–15.0)
LYMPHS ABS: 1.5 10*3/uL (ref 0.7–4.0)
Lymphocytes Relative: 22 % (ref 12–46)
MCH: 29.4 pg (ref 26.0–34.0)
MCHC: 34.3 g/dL (ref 30.0–36.0)
MCV: 85.9 fL (ref 78.0–100.0)
MONO ABS: 0.4 10*3/uL (ref 0.1–1.0)
MONOS PCT: 6 % (ref 3–12)
MPV: 10.9 fL (ref 8.6–12.4)
NEUTROS PCT: 71 % (ref 43–77)
Neutro Abs: 4.9 10*3/uL (ref 1.7–7.7)
Platelets: 228 10*3/uL (ref 150–400)
RBC: 4.18 MIL/uL (ref 3.87–5.11)
RDW: 14 % (ref 11.5–15.5)
WBC: 6.9 10*3/uL (ref 4.0–10.5)

## 2016-01-02 LAB — BASIC METABOLIC PANEL
BUN: 12 mg/dL (ref 7–25)
CALCIUM: 8.9 mg/dL (ref 8.6–10.2)
CO2: 24 mmol/L (ref 20–31)
Chloride: 104 mmol/L (ref 98–110)
Creat: 0.6 mg/dL (ref 0.50–1.10)
Glucose, Bld: 79 mg/dL (ref 65–99)
Potassium: 3.9 mmol/L (ref 3.5–5.3)
Sodium: 139 mmol/L (ref 135–146)

## 2016-01-07 ENCOUNTER — Telehealth: Payer: Self-pay | Admitting: Internal Medicine

## 2016-01-07 NOTE — Telephone Encounter (Signed)
New message      Pt is due to have an ablation on Wednesday.  She started her period yesterday.  Can she still have the procedure?

## 2016-01-07 NOTE — Telephone Encounter (Signed)
Spoke with patient's daughter and let her know okay to proceed

## 2016-01-09 ENCOUNTER — Ambulatory Visit (HOSPITAL_COMMUNITY)
Admission: RE | Admit: 2016-01-09 | Discharge: 2016-01-10 | Disposition: A | Discharge status: Home / Self Care | Payer: BLUE CROSS/BLUE SHIELD | Source: Ambulatory Visit | Attending: Internal Medicine | Admitting: Internal Medicine

## 2016-01-09 ENCOUNTER — Encounter (HOSPITAL_COMMUNITY): Payer: Self-pay | Admitting: General Practice

## 2016-01-09 ENCOUNTER — Encounter (HOSPITAL_COMMUNITY)
Admission: RE | Disposition: A | Discharge status: Home / Self Care | Payer: Self-pay | Source: Ambulatory Visit | Attending: Internal Medicine

## 2016-01-09 DIAGNOSIS — I471 Supraventricular tachycardia, unspecified: Secondary | ICD-10-CM | POA: Diagnosis present

## 2016-01-09 DIAGNOSIS — Z885 Allergy status to narcotic agent status: Secondary | ICD-10-CM | POA: Insufficient documentation

## 2016-01-09 DIAGNOSIS — Z882 Allergy status to sulfonamides status: Secondary | ICD-10-CM | POA: Insufficient documentation

## 2016-01-09 DIAGNOSIS — Z8249 Family history of ischemic heart disease and other diseases of the circulatory system: Secondary | ICD-10-CM | POA: Insufficient documentation

## 2016-01-09 HISTORY — PX: SUPRAVENTRICULAR TACHYCARDIA ABLATION: SHX6106

## 2016-01-09 HISTORY — PX: ELECTROPHYSIOLOGIC STUDY: SHX172A

## 2016-01-09 SURGERY — A-FLUTTER/A-TACH/SVT ABLATION
Anesthesia: LOCAL

## 2016-01-09 MED ORDER — METOPROLOL TARTRATE 25 MG PO TABS
25.0000 mg | ORAL_TABLET | Freq: Two times a day (BID) | ORAL | Status: DC
Start: 2016-01-09 — End: 2016-01-10
  Administered 2016-01-09 – 2016-01-10 (×2): 25 mg via ORAL
  Filled 2016-01-09 (×2): qty 1

## 2016-01-09 MED ORDER — BUPIVACAINE HCL (PF) 0.25 % IJ SOLN
INTRAMUSCULAR | Status: AC
  Filled 2016-01-09: qty 30

## 2016-01-09 MED ORDER — HEPARIN (PORCINE) IN NACL 2-0.9 UNIT/ML-% IJ SOLN
INTRAMUSCULAR | Status: AC
  Filled 2016-01-09: qty 500

## 2016-01-09 MED ORDER — SODIUM CHLORIDE 0.9 % IJ SOLN: 3.0000 mL | INTRAMUSCULAR | Status: DC | PRN

## 2016-01-09 MED ORDER — ONDANSETRON HCL 4 MG/2ML IJ SOLN
4.0000 mg | Freq: Four times a day (QID) | INTRAMUSCULAR | Status: DC | PRN
  Administered 2016-01-09: 4 mg via INTRAVENOUS
  Filled 2016-01-09: qty 2

## 2016-01-09 MED ORDER — MIDAZOLAM HCL 5 MG/5ML IJ SOLN
INTRAMUSCULAR | Status: AC
  Filled 2016-01-09: qty 5

## 2016-01-09 MED ORDER — FENTANYL CITRATE (PF) 100 MCG/2ML IJ SOLN
INTRAMUSCULAR | Status: AC
  Filled 2016-01-09: qty 2

## 2016-01-09 MED ORDER — SODIUM CHLORIDE 0.9 % IJ SOLN
3.0000 mL | Freq: Two times a day (BID) | INTRAMUSCULAR | Status: DC
  Administered 2016-01-10: 3 mL via INTRAVENOUS

## 2016-01-09 MED ORDER — SODIUM CHLORIDE 0.9 % IV SOLN: 250.0000 mL | INTRAVENOUS | Status: DC | PRN

## 2016-01-09 MED ORDER — FENTANYL CITRATE (PF) 100 MCG/2ML IJ SOLN
INTRAMUSCULAR | Status: DC | PRN
Start: 2016-01-09 — End: 2016-01-09
  Administered 2016-01-09: 25 ug via INTRAVENOUS
  Administered 2016-01-09: 12.5 ug via INTRAVENOUS
  Administered 2016-01-09 (×4): 25 ug via INTRAVENOUS
  Administered 2016-01-09: 12.5 ug via INTRAVENOUS
  Administered 2016-01-09 (×2): 25 ug via INTRAVENOUS

## 2016-01-09 MED ORDER — ACETAMINOPHEN 325 MG PO TABS: 650.0000 mg | ORAL_TABLET | ORAL | Status: DC | PRN

## 2016-01-09 MED ORDER — BUPIVACAINE HCL (PF) 0.25 % IJ SOLN
INTRAMUSCULAR | Status: DC | PRN
  Administered 2016-01-09: 55 mL

## 2016-01-09 MED ORDER — SODIUM CHLORIDE 0.9 % IV SOLN
INTRAVENOUS | Status: DC
  Administered 2016-01-09: 10:00:00 via INTRAVENOUS

## 2016-01-09 MED ORDER — OFF THE BEAT BOOK
Freq: Once | Status: AC
  Administered 2016-01-09: 21:00:00
  Filled 2016-01-09: qty 1

## 2016-01-09 MED ORDER — HEPARIN (PORCINE) IN NACL 2-0.9 UNIT/ML-% IJ SOLN
INTRAMUSCULAR | Status: DC | PRN
Start: 2016-01-09 — End: 2016-01-09
  Administered 2016-01-09: 13:00:00

## 2016-01-09 MED ORDER — MIDAZOLAM HCL 5 MG/5ML IJ SOLN
INTRAMUSCULAR | Status: DC | PRN
  Administered 2016-01-09: 2 mg via INTRAVENOUS
  Administered 2016-01-09 (×3): 1 mg via INTRAVENOUS
  Administered 2016-01-09 (×2): 2 mg via INTRAVENOUS
  Administered 2016-01-09: 1 mg via INTRAVENOUS
  Administered 2016-01-09 (×3): 2 mg via INTRAVENOUS
  Administered 2016-01-09: 1 mg via INTRAVENOUS

## 2016-01-09 SURGICAL SUPPLY — 12 items
BAG SNAP BAND KOVER 36X36 (MISCELLANEOUS) ×1 IMPLANT
CATH CELSIUS THERM D CV 7F (ABLATOR) ×1 IMPLANT
CATH HEX JOSEPH 2-5-2 65CM 6F (CATHETERS) IMPLANT
CATH JOSEPHSON QUAD-ALLRED 6FR (CATHETERS) ×2 IMPLANT
CATH POLARIS X 2.5/5/2.5 DECAP (CATHETERS) ×1 IMPLANT
PACK EP LATEX FREE (CUSTOM PROCEDURE TRAY) ×2
PACK EP LF (CUSTOM PROCEDURE TRAY) IMPLANT
PAD DEFIB LIFELINK (PAD) ×1 IMPLANT
SHEATH PINNACLE 6F 10CM (SHEATH) ×2 IMPLANT
SHEATH PINNACLE 7F 10CM (SHEATH) ×1 IMPLANT
SHEATH PINNACLE 8F 10CM (SHEATH) ×1 IMPLANT
SHIELD RADPAD SCOOP 12X17 (MISCELLANEOUS) ×1 IMPLANT

## 2016-01-09 NOTE — Interval H&P Note (Signed)
History and Physical Interval Note:  01/09/2016 10:47 AM  Margaret [name]  has presented today for surgery, with the diagnosis of svt  The various methods of treatment have been discussed with the patient and family. After consideration of risks, benefits and other options for treatment, the patient has consented to  Procedure(s): SVT Ablation (N/A) as a surgical intervention .  The patient's history has been reviewed, patient examined, no change in status, stable for surgery.  I have reviewed the patient's chart and labs.  Questions were answered to the patient's satisfaction.     Cristopher Peru

## 2016-01-09 NOTE — H&P (View-Only) (Signed)
      HPI Margaret [name] is referred today by Dr. Harl Bowie for evaluation of SVT. She is a pleasant 40 yo woman with a h/o tachypalpitations who has had recurrent SVT at 140/min. She has been treated with IV adenosine. The patient was placed on metoprolol which has caused fatigue and chest pressure. She denies syncope with her SVT but does note sob.  Allergies  Allergen Reactions  . Codeine Hives and Itching  . Sulfa Antibiotics Hives     Current Outpatient Prescriptions  Medication Sig Dispense Refill  . metoprolol (LOPRESSOR) 50 MG tablet Take 1.5 tablets (75 mg total) by mouth 2 (two) times daily. 90 tablet 6   No current facility-administered medications for this visit.     Past Medical History  Diagnosis Date  . SVT (supraventricular tachycardia) (HCC)     ROS:   All systems reviewed and negative except as noted in the HPI.   Past Surgical History  Procedure Laterality Date  . Appendectomy    . Cholecystectomy    . Cesarean section      X2     Family History  Problem Relation Age of Onset  . Hypertension Mother   . Heart failure Father   . Hypertension Father   . Stroke Father      Social History   Social History  . Marital Status: Married    Spouse Name: N/A  . Number of Children: N/A  . Years of Education: N/A   Occupational History  . Not on file.   Social History Main Topics  . Smoking status: Never Smoker   . Smokeless tobacco: Not on file  . Alcohol Use: No  . Drug Use: No  . Sexual Activity: Yes   Other Topics Concern  . Not on file   Social History Narrative     BP 120/90 mmHg  Pulse 79  Ht 5\' 3"  (1.6 m)  Wt 226 lb 12.8 oz (102.876 kg)  BMI 40.19 kg/m2  SpO2 97%  Physical Exam:  Stable appearing 40 yo woman, NAD HEENT: Unremarkable Neck:  6 cm JVD, no thyromegally Lymphatics:  No adenopathy Back:  No CVA tenderness Lungs:  Clear with no wheezes rales or rhonchi HEART:  Regular rate rhythm, no murmurs, no rubs, no  clicks Abd:  soft, positive bowel sounds, no organomegally, no rebound, no guarding Ext:  2 plus pulses, no edema, no cyanosis, no clubbing Skin:  No rashes no nodules Neuro:  CN II through XII intact, motor grossly intact  EKG - nsr with no pre-excitation.  Assess/Plan:

## 2016-01-09 NOTE — Progress Notes (Signed)
Site area: rt groin 2-fv sheaths Site Prior to Removal:  Level 0 Pressure Applied For:  20 minutes Manual:   yes Patient Status During Pull:  yes Post Pull Site:  Level  0 Post Pull Instructions Given:  yes Post Pull Pulses Present: yes Dressing Applied:  Small tegaderm Bedrest begins @ 1550 Comments:  IV saline locked

## 2016-01-10 ENCOUNTER — Encounter (HOSPITAL_COMMUNITY): Payer: Self-pay | Admitting: Internal Medicine

## 2016-01-10 DIAGNOSIS — Z885 Allergy status to narcotic agent status: Secondary | ICD-10-CM | POA: Diagnosis not present

## 2016-01-10 DIAGNOSIS — Z882 Allergy status to sulfonamides status: Secondary | ICD-10-CM | POA: Diagnosis not present

## 2016-01-10 DIAGNOSIS — Z8249 Family history of ischemic heart disease and other diseases of the circulatory system: Secondary | ICD-10-CM | POA: Diagnosis not present

## 2016-01-10 DIAGNOSIS — I471 Supraventricular tachycardia: Secondary | ICD-10-CM

## 2016-01-10 MED ORDER — METOPROLOL TARTRATE 50 MG PO TABS: 25.0000 mg | ORAL_TABLET | Freq: Two times a day (BID) | ORAL | Status: DC

## 2016-01-10 NOTE — Discharge Instructions (Signed)
No driving for 4 days. No lifting over 5 lbs for 1 week. No sexual activity for 1 week. You may return to work on Monday, light activities as discussed by Dr. Lovena Le. Keep procedure site clean & dry. If you notice increased pain, swelling, bleeding or pus, call/return!  You may shower, but no soaking baths/hot tubs/pools for 1 week.

## 2016-01-10 NOTE — Discharge Summary (Signed)
     ELECTROPHYSIOLOGY PROCEDURE DISCHARGE SUMMARY    Patient ID: Margaret [name],  MRN: [id number], DOB/AGE: 1976/11/27 40 y.o.  Admit date: 01/09/2016 Discharge date: 01/10/2016  Primary Care Physician: [place] Primary Cardiologist: Dr. Harl Bowie Electrophysiologist: Dr. Lovena Le  Primary Discharge Diagnosis:  SVT   Allergies  Allergen Reactions  . Codeine Hives and Itching  . Sulfa Antibiotics Hives     Procedures This Admission:  EPS/SVT ablation by Dr. Lovena Le CONCLUSIONS:  1. Sinus rhythm upon presentation.  2. 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 Successful radiofrequency modification of the slow AV nodal pathway  4. No inducible arrhythmias following ablation.  5. No early apparent complications.   Brief HPI: Margaret [name] is a 40 y.o. 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 She has been treated with IV adenosine. The patient was placed on metoprolol which has caused fatigue and chest pressure. She denies syncope with her SVT but did note sob. She was recommended to undergo EPS ablation, risks/benefits discussed with her and she was agreeable to proceed.  Hospital Course: She was admitted yesterday electively for EPA/Ablation with Dr. Lovena Le for SVT, specifics of the procedure, noted above and procedure note./  She had no early apparent complications.  She was monitored overnight on telemetry showing nsr.  The patient this morning feels well, procedure sites are stable.  She is seen and examined by Dr. Lovena Le and felt stable for discharge.   Physical Exam: Filed Vitals:   01/09/16 1700 01/09/16 2000 01/09/16 2013 01/10/16 0336  BP: 114/82  117/63 94/58  Pulse: 90 95 83 82  Temp:   98.3 F (36.8 C) 98.1 F (36.7 C)  TempSrc:   Oral Oral  Resp:   25 21  Height:      Weight:     227 lb 1.2 oz (103 kg)  SpO2: 100% 98% 97% 97%   GEN- The patient is well appearing, alert and oriented x 3 today.   HEENT: normocephalic, atraumatic; sclera clear, conjunctiva pink; hearing intact; oropharynx clear; neck supple, no JVP, and minimal bruising Lymph- no cervical lymphadenopathy Lungs- Clear to ausculation bilaterally, normal work of breathing.  No wheezes, rales, rhonchi Heart- Regular rate and rhythm, no murmurs, rubs or gallops, PMI not laterally displaced GI- soft, non-tender, non-distended, bowel sounds present, no hepatosplenomegaly Extremities- no clubbing, cyanosis, or edema; DP/PT/radial pulses 2+ bilaterally MS- no significant deformity or atrophy Skin- warm and dry, no rash or lesion Psych- euthymic mood, full affect Neuro- strength and sensation are intact    Labs:   Lab Results  Component Value Date   WBC 6.9 01/02/2016   HGB 12.3 01/02/2016   HCT 35.9* 01/02/2016   MCV 85.9 01/02/2016   PLT 228 01/02/2016     Discharge Medications:    Medication List    ASK your doctor about these medications        metoprolol 50 MG tablet  Commonly known as:  LOPRESSOR  Take 1.5 tablets (75 mg total) by mouth 2 (two) times daily.        Disposition: Home    Duration of Discharge Encounter: Greater than 30 minutes including physician time.  Nancy Nordmann, PA-C 01/10/2016 6:59 AM  EP Attending  Patient seen and examined. Agree with above. She is stable for discharge. Usual followup.  Mikle Bosworth.D.

## 2016-02-03 NOTE — Progress Notes (Signed)
Cardiology Office Note Date:  02/04/2016  Patient ID:  Rillie, Ode 1976-04-29, MRN [id number] PCP:  [facility]  Cardiologist:  Dr. Harl Bowie Electrophysiologist: Dr. Lovena Le   Chief Complaint: post-ablation visit  History of Present Illness: Margaret [name] is a 40 y.o. female with history of SVT underwent EPS/ablation with Dr. Lovena Le on 01/09/16.  She has no other known medical conditions.  She would like to stop the metoprolol if possible, started only for the SVT.  She comes in today feeling very well, she has not had palpitations.  She did have some slight pains she mentioned Dr. Lovena Le told her to expect but no CP otherwise, no SOB, no dizziness, near syncope or syncope.  She denies any pain, bleeding or complications with her procedure sites.   Past Medical History  Diagnosis Date  . SVT (supraventricular tachycardia) [facility])     Past Surgical History  Procedure Laterality Date  . Cesarean section  1999; 2007  . Appendectomy  1981  . Laparoscopic cholecystectomy  1998  . Tubal ligation  2007  . Supraventricular tachycardia ablation  01/09/2016  . Electrophysiologic study N/A 01/09/2016    Procedure: SVT Ablation;  Surgeon: Evans Lance, MD;  Location: [place] [facility];  Service: Cardiovascular;  Laterality: N/A;    Current Outpatient Prescriptions  Medication Sig Dispense Refill  . metoprolol (LOPRESSOR) 50 MG tablet Take 0.5 tablets (25 mg total) by mouth 2 (two) times daily. 30 tablet 3   No current facility-administered medications for this visit.    Allergies:   Codeine and Sulfa antibiotics   Social History:  The patient  reports that she has never smoked. She has never used smokeless tobacco. She reports that she does not drink alcohol or use illicit drugs.   Family History:  The patient's family history includes Heart failure in her father; Hypertension in her father and mother; Stroke in her father.  ROS:  Please see the history of  present illness. .   All other systems are reviewed and otherwise negative.   PHYSICAL EXAM:  VS:  BP 124/72 mmHg  Pulse 81  Ht 5\' 3"  (1.6 m)  Wt 228 lb (103.42 kg)  BMI 40.40 kg/m2 BMI: Body mass index is 40.4 kg/(m^2). Well nourished, well developed, in no acute distress HEENT: normocephalic, atraumatic Neck: no JVD, carotid bruits or masses Cardiac:  normal S1, S2; RRR; no significant murmurs, no rubs, or gallops Lungs:  clear to auscultation bilaterally, no wheezing, rhonchi or rales Abd: soft, nontender MS: no deformity or atrophy Ext: no edema Skin: warm and dry, no rash Neuro:  No gross deficits appreciated Psych: euthymic mood, full affect  Procedure sites are stable  EKG:  Done today shows SR  01/09/16: EPS/Ablation CONCLUSIONS:  1. Sinus rhythm upon presentation.  2. 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 Successful radiofrequency modification of the slow AV nodal pathway  4. No inducible arrhythmias following ablation.  5. No early apparent complications.   Recent Labs: 04/27/2015: ALT 14 05/14/2015: Magnesium 2.0; TSH 0.773 01/02/2016: BUN 12; Creat 0.60; Hemoglobin 12.3; Platelets 228; Potassium 3.9; Sodium 139  No results found for requested labs within last 365 days.   CrCl cannot be calculated (Patient has no serum creatinine result on file.).   Wt Readings from Last 3 Encounters:  02/04/16 228 lb (103.42 kg)  01/10/16 227 lb  1.2 oz (103 kg)  12/19/15 226 lb 12.8 oz (102.876 kg)     Other studies reviewed: Additional studies/records reviewed today include: summarized above  ASSESSMENT AND PLAN:  1. PSVT       S/p ablation 01/09/16 with Dr. Lovena Le       d/c metoprolol, wean off over a week duration, she is given instructions  She is counseled on weight loss, diet/exercise.  Disposition: F/u with PMD as directed by  them, EP witll see PRN  Current medicines are reviewed at length with the patient today.  The patient did not have any concerns regarding medicines.  Haywood Lasso, PA-C 02/04/2016 9:35 AM     [facility] [address] [place] [address] [phone] (office)  [phone] (fax)

## 2016-02-04 ENCOUNTER — Encounter: Payer: Self-pay | Admitting: Physician Assistant

## 2016-02-04 ENCOUNTER — Ambulatory Visit (INDEPENDENT_AMBULATORY_CARE_PROVIDER_SITE_OTHER): Payer: BLUE CROSS/BLUE SHIELD | Admitting: Physician Assistant

## 2016-02-04 VITALS — BP 124/72 | HR 81 | Ht 63.0 in | Wt 228.0 lb

## 2016-02-04 DIAGNOSIS — I471 Supraventricular tachycardia: Secondary | ICD-10-CM | POA: Diagnosis not present

## 2016-02-04 NOTE — Patient Instructions (Addendum)
Medication Instructions:   START TAKING METOPROLOL 1.ONCE A DAY FOR THREE DAYS                                                        2.EVERY OTHER DAY FOR THREE DAYS                                                        3.THEN STOP     If you need a refill on your cardiac medications before your next appointment, please call your pharmacy.  Labwork: NONE ORDER TODAY    Testing/Procedures: .NONE ORDER TODAY    Follow-Up: AS NEEDED FOR  ANY CARDIAC RELATED SYMPTOMS    Any Other Special Instructions Will Be Listed Below (If Applicable).

## 2016-02-21 ENCOUNTER — Ambulatory Visit: Payer: BLUE CROSS/BLUE SHIELD | Admitting: Cardiology

## 2016-04-22 DIAGNOSIS — E669 Obesity, unspecified: Secondary | ICD-10-CM | POA: Diagnosis not present

## 2016-04-22 DIAGNOSIS — J302 Other seasonal allergic rhinitis: Secondary | ICD-10-CM | POA: Diagnosis not present

## 2016-04-22 DIAGNOSIS — Z6839 Body mass index (BMI) 39.0-39.9, adult: Secondary | ICD-10-CM | POA: Diagnosis not present

## 2016-04-22 DIAGNOSIS — R42 Dizziness and giddiness: Secondary | ICD-10-CM | POA: Diagnosis not present

## 2016-05-01 DIAGNOSIS — E559 Vitamin D deficiency, unspecified: Secondary | ICD-10-CM | POA: Diagnosis not present

## 2016-05-01 DIAGNOSIS — R748 Abnormal levels of other serum enzymes: Secondary | ICD-10-CM | POA: Diagnosis not present

## 2016-05-29 DIAGNOSIS — Z6841 Body Mass Index (BMI) 40.0 and over, adult: Secondary | ICD-10-CM | POA: Diagnosis not present

## 2016-05-29 DIAGNOSIS — E669 Obesity, unspecified: Secondary | ICD-10-CM | POA: Diagnosis not present

## 2016-05-29 DIAGNOSIS — R748 Abnormal levels of other serum enzymes: Secondary | ICD-10-CM | POA: Diagnosis not present

## 2016-06-12 DIAGNOSIS — R748 Abnormal levels of other serum enzymes: Secondary | ICD-10-CM | POA: Diagnosis not present

## 2016-06-12 DIAGNOSIS — Z7189 Other specified counseling: Secondary | ICD-10-CM | POA: Diagnosis not present

## 2016-06-12 DIAGNOSIS — Z6841 Body Mass Index (BMI) 40.0 and over, adult: Secondary | ICD-10-CM | POA: Diagnosis not present

## 2016-06-12 DIAGNOSIS — E669 Obesity, unspecified: Secondary | ICD-10-CM | POA: Diagnosis not present

## 2016-07-10 DIAGNOSIS — Z6841 Body Mass Index (BMI) 40.0 and over, adult: Secondary | ICD-10-CM | POA: Diagnosis not present

## 2016-07-10 DIAGNOSIS — E669 Obesity, unspecified: Secondary | ICD-10-CM | POA: Diagnosis not present

## 2016-08-07 DIAGNOSIS — Z6841 Body Mass Index (BMI) 40.0 and over, adult: Secondary | ICD-10-CM | POA: Diagnosis not present

## 2016-08-07 DIAGNOSIS — E669 Obesity, unspecified: Secondary | ICD-10-CM | POA: Diagnosis not present

## 2016-09-04 DIAGNOSIS — I471 Supraventricular tachycardia: Secondary | ICD-10-CM | POA: Diagnosis not present

## 2016-09-04 DIAGNOSIS — Z6841 Body Mass Index (BMI) 40.0 and over, adult: Secondary | ICD-10-CM | POA: Diagnosis not present

## 2016-09-04 DIAGNOSIS — E669 Obesity, unspecified: Secondary | ICD-10-CM | POA: Diagnosis not present

## 2016-09-04 DIAGNOSIS — Z008 Encounter for other general examination: Secondary | ICD-10-CM | POA: Diagnosis not present

## 2016-10-16 DIAGNOSIS — Z008 Encounter for other general examination: Secondary | ICD-10-CM | POA: Diagnosis not present

## 2016-10-16 DIAGNOSIS — Z6841 Body Mass Index (BMI) 40.0 and over, adult: Secondary | ICD-10-CM | POA: Diagnosis not present

## 2016-10-16 DIAGNOSIS — E669 Obesity, unspecified: Secondary | ICD-10-CM | POA: Diagnosis not present

## 2016-10-16 DIAGNOSIS — I471 Supraventricular tachycardia: Secondary | ICD-10-CM | POA: Diagnosis not present

## 2016-11-24 DIAGNOSIS — Z6839 Body mass index (BMI) 39.0-39.9, adult: Secondary | ICD-10-CM | POA: Diagnosis not present

## 2016-11-24 DIAGNOSIS — R300 Dysuria: Secondary | ICD-10-CM | POA: Diagnosis not present

## 2016-11-24 DIAGNOSIS — Z01419 Encounter for gynecological examination (general) (routine) without abnormal findings: Secondary | ICD-10-CM | POA: Diagnosis not present

## 2016-11-24 DIAGNOSIS — R350 Frequency of micturition: Secondary | ICD-10-CM | POA: Diagnosis not present

## 2017-04-02 DIAGNOSIS — Z1389 Encounter for screening for other disorder: Secondary | ICD-10-CM | POA: Diagnosis not present

## 2017-04-02 DIAGNOSIS — Z719 Counseling, unspecified: Secondary | ICD-10-CM | POA: Diagnosis not present

## 2017-04-02 DIAGNOSIS — Z6841 Body Mass Index (BMI) 40.0 and over, adult: Secondary | ICD-10-CM | POA: Diagnosis not present

## 2017-04-02 DIAGNOSIS — Z008 Encounter for other general examination: Secondary | ICD-10-CM | POA: Diagnosis not present

## 2017-04-02 DIAGNOSIS — I471 Supraventricular tachycardia: Secondary | ICD-10-CM | POA: Diagnosis not present

## 2017-04-02 DIAGNOSIS — E669 Obesity, unspecified: Secondary | ICD-10-CM | POA: Diagnosis not present

## 2017-05-14 ENCOUNTER — Other Ambulatory Visit: Payer: Self-pay

## 2017-05-14 MED ORDER — METOPROLOL TARTRATE 50 MG PO TABS: 25.0000 mg | ORAL_TABLET | Freq: Two times a day (BID) | ORAL | 3 refills | Status: DC

## 2017-06-11 DIAGNOSIS — Z6841 Body Mass Index (BMI) 40.0 and over, adult: Secondary | ICD-10-CM | POA: Diagnosis not present

## 2017-06-11 DIAGNOSIS — Z719 Counseling, unspecified: Secondary | ICD-10-CM | POA: Diagnosis not present

## 2017-06-11 DIAGNOSIS — Z008 Encounter for other general examination: Secondary | ICD-10-CM | POA: Diagnosis not present

## 2017-06-11 DIAGNOSIS — I471 Supraventricular tachycardia: Secondary | ICD-10-CM | POA: Diagnosis not present

## 2017-06-11 DIAGNOSIS — E669 Obesity, unspecified: Secondary | ICD-10-CM | POA: Diagnosis not present

## 2017-07-07 ENCOUNTER — Ambulatory Visit (INDEPENDENT_AMBULATORY_CARE_PROVIDER_SITE_OTHER): Payer: BLUE CROSS/BLUE SHIELD | Admitting: Internal Medicine

## 2017-07-07 ENCOUNTER — Encounter: Payer: Self-pay | Admitting: Internal Medicine

## 2017-07-07 VITALS — BP 132/88 | HR 92 | Ht 64.0 in | Wt 239.4 lb

## 2017-07-07 DIAGNOSIS — I471 Supraventricular tachycardia: Secondary | ICD-10-CM | POA: Diagnosis not present

## 2017-07-07 NOTE — Progress Notes (Signed)
HPI Mrs. Margaret [name] returns today for evaluation of SVT. 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 She has been treated with IV adenosine in the past. She underwent EP study and catheter ablation. It has been 1.5 years since she has been back to see [place]. In the interim, she noted some palpitations and non-cardiac chest pain. She has chest pressure which is not related to exertion and resolves spontaneously. No other complaints.  Allergies  Allergen Reactions  . Codeine Hives and Itching  . Sulfa Antibiotics Hives     No current outpatient prescriptions on file.   No current facility-administered medications for this visit.      Past Medical History:  Diagnosis Date  . SVT (supraventricular tachycardia) (HCC)     ROS:   All systems reviewed and negative except as noted in the HPI.   Past Surgical History:  Procedure Laterality Date  . APPENDECTOMY  1981  . CESAREAN SECTION  1999; 2007  . ELECTROPHYSIOLOGIC STUDY N/A 01/09/2016   Procedure: SVT Ablation;  Surgeon: Evans Lance, MD;  Location: [place] [facility];  Service: Cardiovascular;  Laterality: N/A;  . LAPAROSCOPIC CHOLECYSTECTOMY  1998  . SUPRAVENTRICULAR TACHYCARDIA ABLATION  01/09/2016  . TUBAL LIGATION  2007     Family History  Problem Relation Age of Onset  . Hypertension Mother   . Heart failure Father   . Hypertension Father   . Stroke Father      Social History   Social History  . Marital status: Married    Spouse name: N/A  . Number of children: N/A  . Years of education: N/A   Occupational History  . Not on file.   Social History Main Topics  . Smoking status: Never Smoker  . Smokeless tobacco: Never Used  . Alcohol use No  . Drug use: No  . Sexual activity: Yes   Other Topics Concern  . Not on file   Social History Narrative  . No narrative on file     BP 132/88   Pulse 92   Ht 5\' 4"  (1.626 m)   Wt 239 lb 6.4 oz  (108.6 kg)   SpO2 97%   BMI 41.09 kg/m   Physical Exam:  Stable appearing 41 yo woman, NAD HEENT: Unremarkable Neck:  6 cm JVD, no thyromegally Lymphatics:  No adenopathy Back:  No CVA tenderness Lungs:  Clear with no wheezes rales or rhonchi HEART:  Regular rate rhythm, no murmurs, no rubs, no clicks Abd:  soft, positive bowel sounds, no organomegally, no rebound, no guarding Ext:  2 plus pulses, no edema, no cyanosis, no clubbing Skin:  No rashes no nodules Neuro:  CN II through XII intact, motor grossly intact  EKG - nsr with PVC's.  Assess/Plan: 1. Non-cardiac chest pain - I have discussed the likely benign nature of her symptoms as well as exercise testing. For now will hold off on this. She is instructed to call us. If she notes chest pressure with exertion that gets better with rest, she will undergo stress testing. 2. PVC's - I discussed the benign nature of her PVC's. She feels them but is not too bothered. I have recommended she undergo watchful waiting. I discussed medical therapy but do not recommend this for now. 3. Obesity - she is encouraged to lose weight. She has gaine 13 lbs in the last 1.5 years. 4. SVT - she has some  sinus tachycardia. No evidence of recurrent SVT.  Mikle Bosworth.D.

## 2017-07-07 NOTE — Patient Instructions (Signed)
Medication Instructions:  Your physician recommends that you continue on your current medications as directed. Please refer to the Current Medication list given to you today.   Labwork: None ordered.   Testing/Procedures: None ordered.   Follow-Up: Your physician wants you to follow-up in: 3 months with Dr. Lovena Le.   You will receive a reminder letter in the mail two months in advance. If you don't receive a letter, please call our office to schedule the follow-up appointment.  Dr. Lovena Le requests that you start a walking program.  Please call the office if you have any chest pressure.     Any Other Special Instructions Will Be Listed Below (If Applicable).     If you need a refill on your cardiac medications before your next appointment, please call your pharmacy.

## 2017-07-14 DIAGNOSIS — I471 Supraventricular tachycardia: Secondary | ICD-10-CM | POA: Diagnosis not present

## 2017-07-14 DIAGNOSIS — Z6841 Body Mass Index (BMI) 40.0 and over, adult: Secondary | ICD-10-CM | POA: Diagnosis not present

## 2017-07-14 DIAGNOSIS — Z008 Encounter for other general examination: Secondary | ICD-10-CM | POA: Diagnosis not present

## 2017-07-14 DIAGNOSIS — Z719 Counseling, unspecified: Secondary | ICD-10-CM | POA: Diagnosis not present

## 2017-07-14 DIAGNOSIS — E669 Obesity, unspecified: Secondary | ICD-10-CM | POA: Diagnosis not present

## 2017-10-16 DIAGNOSIS — Z6841 Body Mass Index (BMI) 40.0 and over, adult: Secondary | ICD-10-CM | POA: Diagnosis not present

## 2017-10-16 DIAGNOSIS — K5900 Constipation, unspecified: Secondary | ICD-10-CM | POA: Diagnosis not present

## 2017-10-16 DIAGNOSIS — R109 Unspecified abdominal pain: Secondary | ICD-10-CM | POA: Diagnosis not present

## 2017-10-21 ENCOUNTER — Ambulatory Visit: Payer: BLUE CROSS/BLUE SHIELD | Admitting: Internal Medicine

## 2017-12-01 DIAGNOSIS — Z719 Counseling, unspecified: Secondary | ICD-10-CM | POA: Diagnosis not present

## 2017-12-01 DIAGNOSIS — Z008 Encounter for other general examination: Secondary | ICD-10-CM | POA: Diagnosis not present

## 2017-12-01 DIAGNOSIS — E669 Obesity, unspecified: Secondary | ICD-10-CM | POA: Diagnosis not present

## 2017-12-01 DIAGNOSIS — Z6841 Body Mass Index (BMI) 40.0 and over, adult: Secondary | ICD-10-CM | POA: Diagnosis not present

## 2018-01-12 ENCOUNTER — Encounter: Payer: Self-pay | Admitting: Physician Assistant

## 2018-01-12 NOTE — Progress Notes (Signed)
Cardiology Office Note Date:  01/13/2018  Patient ID:  Margaret [name] [birth year], MRN [id number] PCP:  [facility]  Cardiologist:  Dr. Harl Bowie Electrophysiologist: Dr. Lovena Le   Chief Complaint: post-ablation visit  History of Present Illness: Margaret [name] is a 42 y.o. female with history of SVT underwent EPS/ablation with Dr. Lovena Le on 01/09/16.  She has no other known medical conditions.    She was seen by myself post-ablation in Feb 2017, with no recurrent palpitations her BB weaned off with plans to f/u PRN.  She subsequently saw Dr. Lovena Le in July 2018, c/o some palpitations and CP that he felt was non-cardiac.  No medication changes, noted PVC's, and recommended watchful waiting given not really bothered by the palpitations.  She is doing well.  As pasrt of her health insurance she visits with an RN monthly for VS, weight, and general health management/guidance.  She reports her BP is typically quite good, though the nurse has mentioned that her HR is a little fast, she says is generally 80's90's.  She mentions that she will occassionally still feel the same CP she mentioned to Dr. Lovena Le, can be right or left, is random without a clear trigger, and without associated symptoms, not exertional and last only a couple seconds.  She will occassionally feel a very strong heart beat, no persistent palpitations or racing.  She has not felt her SVT.  She will infrequently feel like there is a feeling of movement type fleeting dizzy feeling, no near syncope or syncope.  She thionks she may be peri-menopausal with irregular menses in thelast several months (not pregnant with tubal ligation), fluctuating periods of hot/cold.  She sees her PMD for annual visit next month.   Past Medical History:  Diagnosis Date  . SVT (supraventricular tachycardia) (HCC)     Past Surgical History:  Procedure Laterality Date  . APPENDECTOMY  1981  . CESAREAN SECTION  1999; 2007  .  ELECTROPHYSIOLOGIC STUDY N/A 01/09/2016   Procedure: SVT Ablation;  Surgeon: Evans Lance, MD;  Location: [place] [facility];  Service: Cardiovascular;  Laterality: N/A;  . LAPAROSCOPIC CHOLECYSTECTOMY  1998  . SUPRAVENTRICULAR TACHYCARDIA ABLATION  01/09/2016  . TUBAL LIGATION  2007    No current outpatient medications on file.   No current facility-administered medications for this visit.     Allergies:   Codeine and Sulfa antibiotics   Social History:  The patient  reports that  has never smoked. she has never used smokeless tobacco. She reports that she does not drink alcohol or use drugs.   Family History:  The patient's family history includes Heart failure in her father; Hypertension in her father and mother; Stroke in her father.  ROS:  Please see the history of present illness. .   All other systems are reviewed and otherwise negative.   PHYSICAL EXAM:  VS:  There were no vitals taken for this visit. BMI: There is no height or weight on file to calculate BMI. Well nourished, well developed, in no acute distress  HEENT: normocephalic, atraumatic  Neck: no JVD, carotid bruits or masses Cardiac:  RRR; no extrasystoles, no murmurs, no rubs, or gallops Lungs:  CTA b/l, no wheezing, rhonchi or rales  Abd: soft, nontender, obese MS: no deformity or atrophy Ext: no edema  Skin: warm and dry, no rash Neuro:  No gross deficits appreciated Psych: euthymic mood, full affect    EKG:  Not done today  01/09/16: EPS/Ablation CONCLUSIONS:  1. Sinus rhythm upon presentation.  2. 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 Successful radiofrequency modification of the slow AV nodal pathway  4. No inducible arrhythmias following ablation.  5. No early apparent complications.   Recent Labs: No results found for requested labs within last 8760 hours.  No  results found for requested labs within last 8760 hours.   CrCl cannot be calculated (Patient's most recent lab result is older than the maximum 21 days allowed.).   Wt Readings from Last 3 Encounters:  07/07/17 239 lb 6.4 oz (108.6 kg)  02/04/16 228 lb (103.4 kg)  01/10/16 227 lb 1.2 oz (103 kg)     Other studies reviewed: Additional studies/records reviewed today include: summarized above  ASSESSMENT AND PLAN:  1.   PSVT       S/p ablation 01/09/16 with Dr. Lovena Le       No symptoms of recurrent SVT  2. PVCs     Infrequent palpitations  3. CP     Atypical, sounds musculoskeletal   BP is a little elevated, she does not exercise and is overweight.  We discussed this, importance of weight management and regular exercise.   She mentioned 140's is unusual, and has BP check monthly, asked to keep an eye on and if routinely >130/80 to let us know.    Disposition: see her annually, or PRN.  Current medicines are reviewed at length with the patient today.  The patient did not have any concerns regarding medicines.  Haywood Lasso, PA-C 01/13/2018 12:10 PM     [facility] [address] [place] [address] [phone] (office)  [phone] (fax)

## 2018-01-13 ENCOUNTER — Ambulatory Visit: Payer: BLUE CROSS/BLUE SHIELD | Admitting: Physician Assistant

## 2018-01-13 VITALS — BP 142/84 | HR 86 | Ht 64.0 in | Wt 245.0 lb

## 2018-01-13 DIAGNOSIS — R0789 Other chest pain: Secondary | ICD-10-CM

## 2018-01-13 DIAGNOSIS — I493 Ventricular premature depolarization: Secondary | ICD-10-CM

## 2018-01-13 DIAGNOSIS — I471 Supraventricular tachycardia: Secondary | ICD-10-CM | POA: Diagnosis not present

## 2018-01-13 NOTE — Patient Instructions (Signed)
Medication Instructions:   Your physician recommends that you continue on your current medications as directed. Please refer to the Current Medication list given to you today.   If you need a refill on your cardiac medications before your next appointment, please call your pharmacy.  Labwork: NONE ORDERED  TODAY    Testing/Procedures: NONE ORDERED  TODAY    Follow-Up:  Your physician wants you to follow-up in: [place] [phone] AS NEEDED FOR  ANY CARDIAC RELATED SYMPTOMS     Any Other Special Instructions Will Be Listed Below (If Applicable).

## 2018-01-21 DIAGNOSIS — E669 Obesity, unspecified: Secondary | ICD-10-CM | POA: Diagnosis not present

## 2018-01-21 DIAGNOSIS — Z6841 Body Mass Index (BMI) 40.0 and over, adult: Secondary | ICD-10-CM | POA: Diagnosis not present

## 2018-01-21 DIAGNOSIS — Z008 Encounter for other general examination: Secondary | ICD-10-CM | POA: Diagnosis not present

## 2018-01-21 DIAGNOSIS — Z719 Counseling, unspecified: Secondary | ICD-10-CM | POA: Diagnosis not present

## 2018-03-02 ENCOUNTER — Ambulatory Visit (HOSPITAL_COMMUNITY)
Admission: RE | Admit: 2018-03-02 | Discharge: 2018-03-02 | Disposition: A | Discharge status: Home / Self Care | Payer: BLUE CROSS/BLUE SHIELD | Source: Ambulatory Visit | Attending: Family Medicine | Admitting: Family Medicine

## 2018-03-02 ENCOUNTER — Other Ambulatory Visit (HOSPITAL_COMMUNITY): Payer: Self-pay | Admitting: Family Medicine

## 2018-03-02 DIAGNOSIS — M47814 Spondylosis without myelopathy or radiculopathy, thoracic region: Secondary | ICD-10-CM | POA: Diagnosis not present

## 2018-03-02 DIAGNOSIS — M546 Pain in thoracic spine: Secondary | ICD-10-CM

## 2018-03-02 DIAGNOSIS — M779 Enthesopathy, unspecified: Secondary | ICD-10-CM | POA: Diagnosis not present

## 2018-03-02 DIAGNOSIS — J309 Allergic rhinitis, unspecified: Secondary | ICD-10-CM | POA: Diagnosis not present

## 2018-03-02 DIAGNOSIS — J351 Hypertrophy of tonsils: Secondary | ICD-10-CM | POA: Diagnosis not present

## 2018-03-02 DIAGNOSIS — R0683 Snoring: Secondary | ICD-10-CM | POA: Diagnosis not present

## 2018-03-02 DIAGNOSIS — M4184 Other forms of scoliosis, thoracic region: Secondary | ICD-10-CM | POA: Insufficient documentation

## 2018-03-30 DIAGNOSIS — M5414 Radiculopathy, thoracic region: Secondary | ICD-10-CM | POA: Diagnosis not present

## 2018-03-30 DIAGNOSIS — M2578 Osteophyte, vertebrae: Secondary | ICD-10-CM | POA: Diagnosis not present

## 2018-03-30 DIAGNOSIS — M4184 Other forms of scoliosis, thoracic region: Secondary | ICD-10-CM | POA: Diagnosis not present

## 2018-03-30 DIAGNOSIS — K219 Gastro-esophageal reflux disease without esophagitis: Secondary | ICD-10-CM | POA: Diagnosis not present

## 2018-04-01 DIAGNOSIS — E669 Obesity, unspecified: Secondary | ICD-10-CM | POA: Diagnosis not present

## 2018-04-01 DIAGNOSIS — Z719 Counseling, unspecified: Secondary | ICD-10-CM | POA: Diagnosis not present

## 2018-04-01 DIAGNOSIS — Z6841 Body Mass Index (BMI) 40.0 and over, adult: Secondary | ICD-10-CM | POA: Diagnosis not present

## 2018-04-01 DIAGNOSIS — Z008 Encounter for other general examination: Secondary | ICD-10-CM | POA: Diagnosis not present

## 2018-04-12 ENCOUNTER — Ambulatory Visit (INDEPENDENT_AMBULATORY_CARE_PROVIDER_SITE_OTHER): Payer: BLUE CROSS/BLUE SHIELD | Admitting: Otolaryngology

## 2018-04-12 DIAGNOSIS — D3705 Neoplasm of uncertain behavior of pharynx: Secondary | ICD-10-CM | POA: Diagnosis not present

## 2018-04-14 ENCOUNTER — Encounter (HOSPITAL_BASED_OUTPATIENT_CLINIC_OR_DEPARTMENT_OTHER): Payer: Self-pay | Admitting: *Deleted

## 2018-04-14 ENCOUNTER — Other Ambulatory Visit: Payer: Self-pay

## 2018-04-14 ENCOUNTER — Other Ambulatory Visit: Payer: Self-pay | Admitting: Otolaryngology

## 2018-04-15 ENCOUNTER — Encounter (HOSPITAL_COMMUNITY)
Admission: RE | Admit: 2018-04-15 | Discharge: 2018-04-15 | Disposition: A | Discharge status: Home / Self Care | Payer: BLUE CROSS/BLUE SHIELD | Source: Ambulatory Visit | Attending: Otolaryngology | Admitting: Otolaryngology

## 2018-04-26 ENCOUNTER — Ambulatory Visit (HOSPITAL_BASED_OUTPATIENT_CLINIC_OR_DEPARTMENT_OTHER): Payer: BLUE CROSS/BLUE SHIELD | Admitting: Anesthesiology

## 2018-04-26 ENCOUNTER — Ambulatory Visit (HOSPITAL_BASED_OUTPATIENT_CLINIC_OR_DEPARTMENT_OTHER)
Admission: RE | Admit: 2018-04-26 | Discharge: 2018-04-26 | Disposition: A | Discharge status: Home / Self Care | Payer: BLUE CROSS/BLUE SHIELD | Source: Ambulatory Visit | Attending: Otolaryngology | Admitting: Otolaryngology

## 2018-04-26 ENCOUNTER — Other Ambulatory Visit: Payer: Self-pay

## 2018-04-26 ENCOUNTER — Encounter (HOSPITAL_BASED_OUTPATIENT_CLINIC_OR_DEPARTMENT_OTHER): Payer: Self-pay

## 2018-04-26 ENCOUNTER — Encounter (HOSPITAL_BASED_OUTPATIENT_CLINIC_OR_DEPARTMENT_OTHER)
Admission: RE | Disposition: A | Discharge status: Home / Self Care | Payer: Self-pay | Source: Ambulatory Visit | Attending: Otolaryngology

## 2018-04-26 DIAGNOSIS — J392 Other diseases of pharynx: Secondary | ICD-10-CM | POA: Diagnosis not present

## 2018-04-26 DIAGNOSIS — Z6841 Body Mass Index (BMI) 40.0 and over, adult: Secondary | ICD-10-CM | POA: Diagnosis not present

## 2018-04-26 DIAGNOSIS — I471 Supraventricular tachycardia: Secondary | ICD-10-CM | POA: Diagnosis not present

## 2018-04-26 DIAGNOSIS — D105 Benign neoplasm of other parts of oropharynx: Secondary | ICD-10-CM | POA: Insufficient documentation

## 2018-04-26 DIAGNOSIS — D3705 Neoplasm of uncertain behavior of pharynx: Secondary | ICD-10-CM | POA: Diagnosis not present

## 2018-04-26 DIAGNOSIS — J387 Other diseases of larynx: Secondary | ICD-10-CM | POA: Diagnosis not present

## 2018-04-26 HISTORY — PX: MICROLARYNGOSCOPY: SHX5208

## 2018-04-26 SURGERY — MICROLARYNGOSCOPY
Anesthesia: General | Laterality: Left

## 2018-04-26 MED ORDER — AMOXICILLIN 875 MG PO TABS: 875.0000 mg | ORAL_TABLET | Freq: Two times a day (BID) | ORAL | 0 refills | Status: AC

## 2018-04-26 MED ORDER — FENTANYL CITRATE (PF) 100 MCG/2ML IJ SOLN
INTRAMUSCULAR | Status: AC
Start: 2018-04-26 — End: ?
  Filled 2018-04-26: qty 2

## 2018-04-26 MED ORDER — PROMETHAZINE HCL 25 MG/ML IJ SOLN: 6.2500 mg | INTRAMUSCULAR | Status: DC | PRN

## 2018-04-26 MED ORDER — CEFAZOLIN SODIUM 1 G IJ SOLR
INTRAMUSCULAR | Status: AC
  Filled 2018-04-26: qty 20

## 2018-04-26 MED ORDER — LIDOCAINE HCL (CARDIAC) PF 100 MG/5ML IV SOSY
PREFILLED_SYRINGE | INTRAVENOUS | Status: AC
  Filled 2018-04-26: qty 5

## 2018-04-26 MED ORDER — PHENYLEPHRINE 40 MCG/ML (10ML) SYRINGE FOR IV PUSH (FOR BLOOD PRESSURE SUPPORT)
PREFILLED_SYRINGE | INTRAVENOUS | Status: AC
  Filled 2018-04-26: qty 10

## 2018-04-26 MED ORDER — HYDROMORPHONE HCL 1 MG/ML IJ SOLN: 0.2500 mg | INTRAMUSCULAR | Status: DC | PRN

## 2018-04-26 MED ORDER — MIDAZOLAM HCL 2 MG/2ML IJ SOLN
INTRAMUSCULAR | Status: AC
  Filled 2018-04-26: qty 2

## 2018-04-26 MED ORDER — LACTATED RINGERS IV SOLN
INTRAVENOUS | Status: DC
  Administered 2018-04-26: 09:00:00 via INTRAVENOUS

## 2018-04-26 MED ORDER — EPINEPHRINE PF 1 MG/ML IJ SOLN
INTRAMUSCULAR | Status: DC | PRN
  Administered 2018-04-26: 2 mL

## 2018-04-26 MED ORDER — PHENYLEPHRINE 40 MCG/ML (10ML) SYRINGE FOR IV PUSH (FOR BLOOD PRESSURE SUPPORT)
PREFILLED_SYRINGE | INTRAVENOUS | Status: DC | PRN
  Administered 2018-04-26: 80 ug via INTRAVENOUS

## 2018-04-26 MED ORDER — SUCCINYLCHOLINE CHLORIDE 200 MG/10ML IV SOSY
PREFILLED_SYRINGE | INTRAVENOUS | Status: AC
  Filled 2018-04-26: qty 10

## 2018-04-26 MED ORDER — CEFAZOLIN SODIUM-DEXTROSE 2-3 GM-%(50ML) IV SOLR
INTRAVENOUS | Status: DC | PRN
  Administered 2018-04-26: 2 g via INTRAVENOUS

## 2018-04-26 MED ORDER — ESMOLOL HCL 100 MG/10ML IV SOLN
INTRAVENOUS | Status: DC | PRN
  Administered 2018-04-26 (×2): 25 mg via INTRAVENOUS

## 2018-04-26 MED ORDER — ONDANSETRON HCL 4 MG/2ML IJ SOLN
INTRAMUSCULAR | Status: AC
  Filled 2018-04-26: qty 2

## 2018-04-26 MED ORDER — PROPOFOL 10 MG/ML IV BOLUS
INTRAVENOUS | Status: AC
  Filled 2018-04-26: qty 20

## 2018-04-26 MED ORDER — DEXAMETHASONE SODIUM PHOSPHATE 4 MG/ML IJ SOLN
INTRAMUSCULAR | Status: DC | PRN
  Administered 2018-04-26: 10 mg via INTRAVENOUS

## 2018-04-26 MED ORDER — SCOPOLAMINE 1 MG/3DAYS TD PT72: 1.0000 | MEDICATED_PATCH | Freq: Once | TRANSDERMAL | Status: DC | PRN

## 2018-04-26 MED ORDER — DEXAMETHASONE SODIUM PHOSPHATE 10 MG/ML IJ SOLN
INTRAMUSCULAR | Status: AC
Start: 2018-04-26 — End: ?
  Filled 2018-04-26: qty 1

## 2018-04-26 MED ORDER — FENTANYL CITRATE (PF) 100 MCG/2ML IJ SOLN
50.0000 ug | INTRAMUSCULAR | Status: DC | PRN
  Administered 2018-04-26: 100 ug via INTRAVENOUS

## 2018-04-26 MED ORDER — MIDAZOLAM HCL 2 MG/2ML IJ SOLN
1.0000 mg | INTRAMUSCULAR | Status: DC | PRN
  Administered 2018-04-26: 2 mg via INTRAVENOUS

## 2018-04-26 MED ORDER — ONDANSETRON HCL 4 MG/2ML IJ SOLN
INTRAMUSCULAR | Status: DC | PRN
  Administered 2018-04-26: 4 mg via INTRAVENOUS

## 2018-04-26 MED ORDER — LIDOCAINE HCL (CARDIAC) PF 100 MG/5ML IV SOSY
PREFILLED_SYRINGE | INTRAVENOUS | Status: DC | PRN
  Administered 2018-04-26: 100 mg via INTRAVENOUS

## 2018-04-26 MED ORDER — ESMOLOL HCL 100 MG/10ML IV SOLN
INTRAVENOUS | Status: AC
Start: 2018-04-26 — End: ?
  Filled 2018-04-26: qty 10

## 2018-04-26 MED ORDER — PROPOFOL 10 MG/ML IV BOLUS
INTRAVENOUS | Status: DC | PRN
  Administered 2018-04-26: 150 mg via INTRAVENOUS
  Administered 2018-04-26: 50 mg via INTRAVENOUS

## 2018-04-26 SURGICAL SUPPLY — 24 items
CANISTER SUCT 1200ML W/VALVE (MISCELLANEOUS) ×2 IMPLANT
GAUZE SPONGE 4X4 12PLY STRL LF (GAUZE/BANDAGES/DRESSINGS) ×2 IMPLANT
GLOVE BIO SURGEON STRL SZ7.5 (GLOVE) ×2 IMPLANT
GLOVE BIOGEL PI IND STRL 7.0 (GLOVE) IMPLANT
GLOVE BIOGEL PI INDICATOR 7.0 (GLOVE) ×1
GLOVE ECLIPSE 6.5 STRL STRAW (GLOVE) ×1 IMPLANT
GOWN STRL REUS W/ TWL LRG LVL3 (GOWN DISPOSABLE) ×1 IMPLANT
GOWN STRL REUS W/TWL LRG LVL3 (GOWN DISPOSABLE)
GUARD TEETH (MISCELLANEOUS) ×2 IMPLANT
MARKER SKIN DUAL TIP RULER LAB (MISCELLANEOUS) ×2 IMPLANT
NDL SAFETY ECLIPSE 18X1.5 (NEEDLE) ×1 IMPLANT
NDL SPNL 22GX7 QUINCKE BK (NEEDLE) IMPLANT
NEEDLE HYPO 18GX1.5 SHARP (NEEDLE) ×2
NEEDLE SPNL 22GX7 QUINCKE BK (NEEDLE) IMPLANT
NS IRRIG 1000ML POUR BTL (IV SOLUTION) ×2 IMPLANT
PACK BASIN DAY SURGERY FS (CUSTOM PROCEDURE TRAY) ×2 IMPLANT
PATTIES SURGICAL .5 X3 (DISPOSABLE) ×2 IMPLANT
SHEET MEDIUM DRAPE 40X70 STRL (DRAPES) ×2 IMPLANT
SLEEVE SCD COMPRESS KNEE MED (MISCELLANEOUS) ×2 IMPLANT
SOLUTION BUTLER CLEAR DIP (MISCELLANEOUS) ×2 IMPLANT
SYR CONTROL 10ML LL (SYRINGE) ×2 IMPLANT
SYR TB 1ML LL NO SAFETY (SYRINGE) ×2 IMPLANT
TOWEL OR 17X24 6PK STRL BLUE (TOWEL DISPOSABLE) ×2 IMPLANT
TUBE CONNECTING 20X1/4 (TUBING) ×2 IMPLANT

## 2018-04-26 NOTE — Transfer of Care (Signed)
Immediate Anesthesia Transfer of Care Note  Patient: Margaret [name] [facility]  Procedure(s) Performed: MICROLARYNGOSCOPY WITH EXCISION OF VALLECULER MASS (Left )  Patient Location: PACU  Anesthesia Type:General  Level of Consciousness: awake  Airway & Oxygen Therapy: Patient Spontanous Breathing and aerosol face mask  Post-op Assessment: Report given to RN and Post -op Vital signs reviewed and stable  Post vital signs: Reviewed and stable  Last Vitals:  Vitals Value Taken Time  BP    Temp    Pulse 117 04/26/2018 12:02 PM  Resp 22 04/26/2018 12:02 PM  SpO2 100 % 04/26/2018 12:02 PM  Vitals shown include unvalidated device data.  Last Pain:  Vitals:   04/26/18 0851  TempSrc: Oral         Complications: No apparent anesthesia complications

## 2018-04-26 NOTE — Discharge Instructions (Addendum)
The patient may resume all her previous activities and diet. She will follow-up in my office in one week. ° ° ° ° ° ° °Post Anesthesia Home Care Instructions ° °Activity: °Get plenty of rest for the remainder of the day. A responsible individual must stay with you for 24 hours following the procedure.  °For the next 24 hours, DO NOT: °-Drive a car °-Operate machinery °-Drink alcoholic beverages °-Take any medication unless instructed by your physician °-Make any legal decisions or sign important papers. ° °Meals: °Start with liquid foods such as gelatin or soup. Progress to regular foods as tolerated. Avoid greasy, spicy, heavy foods. If nausea and/or vomiting occur, drink only clear liquids until the nausea and/or vomiting subsides. Call your physician if vomiting continues. ° °Special Instructions/Symptoms: °Your throat may feel dry or sore from the anesthesia or the breathing tube placed in your throat during surgery. If this causes discomfort, gargle with warm salt water. The discomfort should disappear within 24 hours. ° °If you had a scopolamine patch placed behind your ear for the management of post- operative nausea and/or vomiting: ° °1. The medication in the patch is effective for 72 hours, after which it should be removed.  Wrap patch in a tissue and discard in the trash. Wash hands thoroughly with soap and water. °2. You may remove the patch earlier than 72 hours if you experience unpleasant side effects which may include dry mouth, dizziness or visual disturbances. °3. Avoid touching the patch. Wash your hands with soap and water after contact with the patch. °  ° °

## 2018-04-26 NOTE — Anesthesia Procedure Notes (Signed)
Procedure Name: Intubation Date/Time: 04/26/2018 11:24 AM Performed by: Lyndee Leo, CRNA Pre-anesthesia Checklist: Patient identified, Emergency Drugs available, Suction available and Patient being monitored Patient Re-evaluated:Patient Re-evaluated prior to induction Oxygen Delivery Method: Circle system utilized Preoxygenation: Pre-oxygenation with 100% oxygen Induction Type: IV induction Ventilation: Mask ventilation without difficulty Laryngoscope Size: Mac and 3 Grade View: Grade II Tube type: Oral Tube size: 7.0 mm Number of attempts: 1 Airway Equipment and Method: Stylet and Oral airway Placement Confirmation: ETT inserted through vocal cords under direct vision,  positive ETCO2 and breath sounds checked- equal and bilateral Secured at: 21 cm Tube secured with: Tape Dental Injury: Teeth and Oropharynx as per pre-operative assessment

## 2018-04-26 NOTE — Anesthesia Postprocedure Evaluation (Signed)
Anesthesia Post Note  Patient: Margaret [name] [facility]  Procedure(s) Performed: MICROLARYNGOSCOPY WITH EXCISION OF VALLECULER MASS (Left )     Patient location during evaluation: PACU Anesthesia Type: General Level of consciousness: awake and alert Pain management: pain level controlled Vital Signs Assessment: post-procedure vital signs reviewed and stable Respiratory status: spontaneous breathing, nonlabored ventilation, respiratory function stable and patient connected to nasal cannula oxygen Cardiovascular status: blood pressure returned to baseline and stable Postop Assessment: no apparent nausea or vomiting Anesthetic complications: no    Last Vitals:  Vitals:   04/26/18 1300 04/26/18 1315  BP: 118/67 125/77  Pulse: 100 95  Resp: 19 18  Temp:  36.7 C  SpO2: 97% 93%    Last Pain:  Vitals:   04/26/18 1300  TempSrc:   PainSc: 3                  Ryan P Ellender

## 2018-04-26 NOTE — Anesthesia Preprocedure Evaluation (Addendum)
Anesthesia Evaluation  Patient identified by MRN, date of birth, ID band Patient awake    Reviewed: Allergy & Precautions, NPO status , Patient's Chart, lab work & pertinent test results  Airway Mallampati: III  TM Distance: >3 FB Neck ROM: Full    Dental no notable dental hx.    Pulmonary neg pulmonary ROS,    Pulmonary exam normal breath sounds clear to auscultation       Cardiovascular Normal cardiovascular exam+ dysrhythmias Supra Ventricular Tachycardia  Rhythm:Regular Rate:Normal  ECG: ST, rate 105  Sees cardiologist   Neuro/Psych negative neurological ROS  negative psych ROS   GI/Hepatic negative GI ROS, Neg liver ROS,   Endo/Other  Morbid obesity  Renal/GU negative Renal ROS     Musculoskeletal negative musculoskeletal ROS (+)   Abdominal (+) + obese,   Peds  Hematology negative hematology ROS (+)   Anesthesia Other Findings LEFT VALLECULER MAS  Reproductive/Obstetrics                            Anesthesia Physical Anesthesia Plan  ASA: III  Anesthesia Plan: General   Post-op Pain Management:    Induction: Intravenous  PONV Risk Score and Plan: 3 and Midazolam, Dexamethasone, Ondansetron, Treatment may vary due to age or medical condition and Propofol infusion  Airway Management Planned: Oral ETT  Additional Equipment:   Intra-op Plan:   Post-operative Plan: Extubation in OR  Informed Consent: I have reviewed the patients History and Physical, chart, labs and discussed the procedure including the risks, benefits and alternatives for the proposed anesthesia with the patient or authorized representative who has indicated his/her understanding and acceptance.   Dental advisory given  Plan Discussed with: CRNA  Anesthesia Plan Comments:         Anesthesia Quick Evaluation

## 2018-04-26 NOTE — Op Note (Signed)
DATE OF PROCEDURE:  04/26/2018                              OPERATIVE REPORT  SURGEON:  Leta Baptist, MD  PREOPERATIVE DIAGNOSES: 1. Vallecular mass  POSTOPERATIVE DIAGNOSES: 1. Vallecular mass  PROCEDURE PERFORMED:  Micro-direct laryngoscopy with excision of a left vallecula mass.  ANESTHESIA:  General endotracheal tube anesthesia.  COMPLICATIONS:  None.  ESTIMATED BLOOD LOSS:  Minimal.  INDICATION FOR PROCEDURE:  Margaret [name] is a 42 y.o. female with a history of chronic throat discomfort and frequent gagging sensation. She has been symptomatic for the past 6-8 months. On physical examination, the patient was noted to have a large left vallecula mass. No surface ulceration was noted. Based on the above findings, the decision was made for the patient to undergo the micro-direct laryngoscopy and surgical excision of the mass. Likelihood of success in reducing symptoms was also discussed.  The risks, benefits, alternatives, and details of the procedure were discussed with the patient.  Questions were invited and answered.  Informed consent was obtained.  DESCRIPTION:  The patient was taken to the operating room and placed supine on the operating table.  General endotracheal tube anesthesia was administered by the anesthesiologist.  The patient was positioned and prepped and draped in a standard fashion for direct laryngoscopy.  A Dedo laryngoscope was used for examination. The laryngoscope was inserted via the oral cavity into the pharynx. A 1.5 cm soft tissue mass was noted within the left vallecula. The epiglottis, aryepiglottic folds, piriform sinuses, and the vocal cords were all normal. The Dedo laryngoscope was suspended with a Lewy suspender. An operating microscope was brought into the field. Photodocumentation of the vallecula mass was obtained. The entire mass was excised using a laryngeal forceps and a pair of laryngeal scissors. The specimens were sent to the pathology department for  permanent histologic identification. Hemostasis was achieved with pledgets soaked with epinephrine.  The care of the patient was turned over to the anesthesiologist.  The patient was awakened from anesthesia without difficulty.  The patient was extubated and transferred to the recovery room in good condition.  OPERATIVE FINDINGS:  A 1.5 cm soft tissue mass was noted within the left vallecula.  SPECIMEN:  Vallecular mass.  FOLLOWUP CARE:  The patient will be discharged home once awake and alert.  She will follow-up in my office in one week.  Ki Corbo W Edrik Rundle 04/26/2018 11:54 AM

## 2018-04-26 NOTE — H&P (Signed)
Cc: Vallecular mass, frequent gagging sensation  HPI: The patient is a 42 y/o female who presents today for evaluation of excessive gagging. The patient is seen in consultation requested by [facility]. The patient has noted frequent gagging for the past 6-8 months. Her symptoms are worse in the mornings but can occur throughout the day. The patient has occasional vomiting but frequent dry heaves. She has reflux symptoms but only takes medication as needed. No dysphagia or odynophagia is noted. The patient has no history of tobacco use. Previous ENT surgery is denied.   The patient's review of systems (constitutional, eyes, ENT, cardiovascular, respiratory, GI, musculoskeletal, skin, neurologic, psychiatric, endocrine, hematologic, allergic) is noted in the ROS questionnaire.  It is reviewed with the patient.   Family health history: Diabetes, heart disease.  Major events: Appendectomy, gallbladder removed, ablation, C-section X2.  Ongoing medical problems: Reflux.  Social history: The patient is married. She denies the use of tobacco, alcohol or illegal drugs.   Exam General: Communicates without difficulty, well nourished, no acute distress. Head: Normocephalic, no evidence injury, no tenderness, facial buttresses intact without stepoff. Eyes: PERRL, EOMI.  No scleral icterus, conjunctivae clear. Ears: External auditory canals clear bilaterally.  There is no edema or erythema.  Tympanic membrane is within normal limits bilaterally. Nose: Normal skin and external support.  Anterior rhinoscopy reveals healthy pink mucosa over the septum and turbinates.  No lesions or polyps were seen. Oral cavity: Lips without lesions, oral mucosa moist, no masses or lesions seen. Tonsils 1+. Indirect  mirror laryngoscopy could not be tolerated. Pharynx: Clear, no erythema. Neck: Supple, full range of motion, no lymphadenopathy, no masses palpable. Salivary: Parotid and submandibular glands without mass. Neuro:  CN  2-12 grossly intact. Gait normal. Vestibular: No nystagmus at any point of gaze.   Procedure:  Flexible Fiberoptic Laryngoscopy -- Risks, benefits, and alternatives of flexible endoscopy were explained to the patient.  Specific mention was made of the risk of throat numbness with difficulty swallowing, possible bleeding from the nose and mouth, and pain from the procedure.  The patient gave oral consent to proceed.  The nasal cavities were decongested and anesthetised with a combination of oxymetazoline and 4% lidocaine solution.  The flexible scope was inserted into the right nasal cavity and advanced towards the nasopharynx.  Visualized mucosa over the turbinates and septum were as described above.  The nasopharynx was clear.  Oropharyngeal walls were symmetric and mobile without lesion, mass, or edema.  Hypopharynx was also without  lesion or edema.  Larynx was mobile without lesions. A large cystic lesion was noted in the right vallecula. Supraglottic structures were free of edema, mass, and asymmetry.  True vocal folds were white without mass or lesion.  Base of tongue was within normal limits.  The patient tolerated the procedure well.   Assessment A large cystic lesion is noted in the right vallecula, which may be the source of the patient's gagging response.  No other suspicious mass or lesion is noted on today's fiberoptic laryngoscopy exam.  Plan  1. The physical exam and laryngoscopy findings are reviewed with the patient.  2. Recommend direct laryngoscopy with excision of the mass. The risks, benefits, alternatives, and details of the procedure are reviewed with the patient. Questions are invited and answered. 3.  The patient is interested in proceeding with the procedure.  We will schedule the procedure in accordance with the family schedule.

## 2018-04-27 ENCOUNTER — Encounter (HOSPITAL_BASED_OUTPATIENT_CLINIC_OR_DEPARTMENT_OTHER): Payer: Self-pay | Admitting: Otolaryngology

## 2018-05-06 ENCOUNTER — Ambulatory Visit (INDEPENDENT_AMBULATORY_CARE_PROVIDER_SITE_OTHER): Payer: BLUE CROSS/BLUE SHIELD | Admitting: Otolaryngology

## 2018-05-06 DIAGNOSIS — D109 Benign neoplasm of pharynx, unspecified: Secondary | ICD-10-CM

## 2018-07-22 ENCOUNTER — Emergency Department (HOSPITAL_COMMUNITY): Payer: BLUE CROSS/BLUE SHIELD

## 2018-07-22 ENCOUNTER — Encounter (HOSPITAL_COMMUNITY): Payer: Self-pay

## 2018-07-22 ENCOUNTER — Emergency Department (HOSPITAL_COMMUNITY)
Admission: EM | Admit: 2018-07-22 | Discharge: 2018-07-22 | Disposition: A | Discharge status: Home / Self Care | Payer: BLUE CROSS/BLUE SHIELD | Attending: Emergency Medicine | Admitting: Emergency Medicine

## 2018-07-22 ENCOUNTER — Other Ambulatory Visit: Payer: Self-pay

## 2018-07-22 DIAGNOSIS — E669 Obesity, unspecified: Secondary | ICD-10-CM | POA: Diagnosis not present

## 2018-07-22 DIAGNOSIS — Z6841 Body Mass Index (BMI) 40.0 and over, adult: Secondary | ICD-10-CM | POA: Diagnosis not present

## 2018-07-22 DIAGNOSIS — R0602 Shortness of breath: Secondary | ICD-10-CM | POA: Diagnosis not present

## 2018-07-22 DIAGNOSIS — I471 Supraventricular tachycardia: Secondary | ICD-10-CM | POA: Diagnosis not present

## 2018-07-22 DIAGNOSIS — R079 Chest pain, unspecified: Secondary | ICD-10-CM | POA: Diagnosis not present

## 2018-07-22 DIAGNOSIS — R0600 Dyspnea, unspecified: Secondary | ICD-10-CM | POA: Diagnosis not present

## 2018-07-22 DIAGNOSIS — E041 Nontoxic single thyroid nodule: Secondary | ICD-10-CM

## 2018-07-22 DIAGNOSIS — R059 Cough, unspecified: Secondary | ICD-10-CM

## 2018-07-22 DIAGNOSIS — Z008 Encounter for other general examination: Secondary | ICD-10-CM | POA: Diagnosis not present

## 2018-07-22 DIAGNOSIS — R05 Cough: Secondary | ICD-10-CM | POA: Insufficient documentation

## 2018-07-22 LAB — CBC
HCT: 38.5 % (ref 36.0–46.0)
Hemoglobin: 12.2 g/dL (ref 12.0–15.0)
MCH: 27.3 pg (ref 26.0–34.0)
MCHC: 31.7 g/dL (ref 30.0–36.0)
MCV: 86.1 fL (ref 78.0–100.0)
PLATELETS: 204 10*3/uL (ref 150–400)
RBC: 4.47 MIL/uL (ref 3.87–5.11)
RDW: 14.9 % (ref 11.5–15.5)
WBC: 10.8 10*3/uL — ABNORMAL HIGH (ref 4.0–10.5)

## 2018-07-22 LAB — BASIC METABOLIC PANEL
Anion gap: 8 (ref 5–15)
BUN: 14 mg/dL (ref 6–20)
CO2: 26 mmol/L (ref 22–32)
Calcium: 9.1 mg/dL (ref 8.9–10.3)
Chloride: 107 mmol/L (ref 98–111)
Creatinine, Ser: 0.6 mg/dL (ref 0.44–1.00)
GFR calc Af Amer: >60 mL/min (ref >60)
GFR calc non Af Amer: >60 mL/min (ref >60)
Glucose, Bld: 105 mg/dL — ABNORMAL HIGH (ref 70–99)
Potassium: 3.2 mmol/L — ABNORMAL LOW (ref 3.5–5.1)
Sodium: 141 mmol/L (ref 135–145)

## 2018-07-22 LAB — TROPONIN I

## 2018-07-22 LAB — D-DIMER, QUANTITATIVE: D-Dimer, Quant: 0.33 ug/mL-FEU (ref 0.00–0.50)

## 2018-07-22 MED ORDER — ALBUTEROL SULFATE HFA 108 (90 BASE) MCG/ACT IN AERS: 1.0000 | INHALATION_SPRAY | Freq: Four times a day (QID) | RESPIRATORY_TRACT | 0 refills | Status: DC | PRN

## 2018-07-22 MED ORDER — IOPAMIDOL (ISOVUE-300) INJECTION 61%: 50.0000 mL | Freq: Once | INTRAVENOUS | Status: DC | PRN

## 2018-07-22 MED ORDER — IOPAMIDOL (ISOVUE-370) INJECTION 76%
80.0000 mL | Freq: Once | INTRAVENOUS | Status: AC | PRN
  Administered 2018-07-22: 80 mL via INTRAVENOUS

## 2018-07-22 MED ORDER — IOHEXOL 300 MG/ML  SOLN
50.0000 mL | Freq: Once | INTRAMUSCULAR | Status: AC | PRN
  Administered 2018-07-22: 50 mL via INTRAVENOUS

## 2018-07-22 MED ORDER — PREDNISONE 10 MG PO TABS: 20.0000 mg | ORAL_TABLET | Freq: Every day | ORAL | 0 refills | Status: DC

## 2018-07-22 NOTE — ED Provider Notes (Signed)
[facility] EMERGENCY DEPARTMENT Provider Note   CSN: [id number] Arrival date & time: 07/22/18  1752     History   Chief Complaint Chief Complaint  Patient presents with  . Chest Pain    HPI Margaret [name] is a 42 y.o. female.  Status post 04/26/2018 microlaryngoscopy with excision of left vallecular mass by Dr. Benjamine Mola.  Patient reported chest pain and tachycardia at work this morning.  Patient has also complained of a dry hacking cough since her throat surgery.  Past medical history includes "ablation October 2016 by Dr. Lovena Le for SVT".  No cigarettes, no alcohol, no street drugs.  Review of systems negative for fever, sweats, chills, dyspnea, recent travel, prolonged immobilization.  Severity of symptoms is moderate.  Nothing makes symptoms better or worse.     Past Medical History:  Diagnosis Date  . SVT (supraventricular tachycardia) [facility])     Patient Active Problem List   Diagnosis Date Noted  . SVT (supraventricular tachycardia) ([place]) 01/09/2016  . PSVT (paroxysmal supraventricular tachycardia) ([place]) 05/14/2015    Past Surgical History:  Procedure Laterality Date  . APPENDECTOMY  1981  . CESAREAN SECTION  1999; 2007  . ELECTROPHYSIOLOGIC STUDY N/A 01/09/2016   Procedure: SVT Ablation;  Surgeon: Evans Lance, MD;  Location: [place] [facility];  Service: Cardiovascular;  Laterality: N/A;  . LAPAROSCOPIC CHOLECYSTECTOMY  1998  . MICROLARYNGOSCOPY Left 04/26/2018   Procedure: MICROLARYNGOSCOPY WITH EXCISION OF VALLECULER MASS;  Surgeon: Leta Baptist, MD;  Location: [place];  Service: ENT;  Laterality: Left;  . SUPRAVENTRICULAR TACHYCARDIA ABLATION  01/09/2016  . TUBAL LIGATION  2007     OB History    Gravida  2   Para  2   Term  2   Preterm      AB      Living        SAB      TAB      Ectopic      Multiple      Live Births               Home Medications    Prior to Admission medications   Medication Sig Start Date End Date  Taking? Authorizing Provider  acetaminophen (TYLENOL) 500 MG tablet Take 1,000 mg by mouth every 6 (six) hours as needed for mild pain or headache.   Yes [provider]  albuterol (PROVENTIL HFA;VENTOLIN HFA) 108 (90 Base) MCG/ACT inhaler Inhale 1-2 puffs into the lungs every 6 (six) hours as needed for wheezing or shortness of breath. 07/22/18   Nat Christen, MD  predniSONE (DELTASONE) 10 MG tablet Take 2 tablets (20 mg total) by mouth daily. 07/22/18   Nat Christen, MD    Family History Family History  Problem Relation Age of Onset  . Hypertension Mother   . Heart failure Father   . Hypertension Father   . Stroke Father     Social History Social History   Tobacco Use  . Smoking status: Never Smoker  . Smokeless tobacco: Never Used  Substance Use Topics  . Alcohol use: No  . Drug use: No     Allergies   Codeine and Sulfa antibiotics   Review of Systems Review of Systems  All other systems reviewed and are negative.    Physical Exam Updated Vital Signs BP (!) 147/102   Pulse (!) 115   Temp 98.4 F (36.9 C) (Oral)   Resp (!) 23   Ht 5\' 3"  (  1.6 m)   Wt 111.1 kg (245 lb)   SpO2 97%   BMI 43.40 kg/m   Physical Exam  Constitutional: She is oriented to person, place, and time. She appears well-developed and well-nourished.  coughing  HENT:  Head: Normocephalic and atraumatic.  Eyes: Conjunctivae are normal.  Neck: Neck supple.  Cardiovascular: Regular rhythm. Tachycardia present.  Pulmonary/Chest: Effort normal and breath sounds normal.  Abdominal: Soft. Bowel sounds are normal.  Musculoskeletal: Normal range of motion.  Neurological: She is alert and oriented to person, place, and time.  Skin: Skin is warm and dry.  Psychiatric: She has a normal mood and affect. Her behavior is normal.  Nursing note and vitals reviewed.    ED Treatments / Results  Labs (all labs ordered are listed, but only abnormal results are displayed) Labs Reviewed  BASIC  METABOLIC PANEL - Abnormal; Notable for the following components:      Result Value   Potassium 3.2 (*)    Glucose, Bld 105 (*)    All other components within normal limits  CBC - Abnormal; Notable for the following components:   WBC 10.8 (*)    All other components within normal limits  TROPONIN I  D-DIMER, QUANTITATIVE (NOT AT [facility])    EKG EKG Interpretation  Date/Time:  Thursday July 22 2018 18:22:26 EDT Ventricular Rate:  119 PR Interval:  122 QRS Duration: 88 QT Interval:  318 QTC Calculation: 447 R Axis:   16 Text Interpretation:  Sinus tachycardia with frequent Premature ventricular complexes Left ventricular hypertrophy with repolarization abnormality Abnormal ECG Confirmed by Nat Christen [phone]) on 07/22/2018 6:52:44 PM   Radiology Dg Chest 2 View  Result Date: 07/22/2018 CLINICAL DATA:  Chest pain, shortness of breath and productive cough for several days. EXAM: CHEST - 2 VIEW COMPARISON:  04/27/2015. FINDINGS: Normal sized heart. Clear lungs. Mild peribronchial thickening. Minimal thoracic spine degenerative changes. Cholecystectomy clips. IMPRESSION: Mild bronchitic changes. Electronically Signed   By: Claudie Revering M.D.   On: 07/22/2018 19:02   Ct Soft Tissue Neck W Contrast  Result Date: 07/22/2018 CLINICAL DATA:  Hemoptysis and shortness of breath. Recent vallecula mass excision. EXAM: CT NECK WITH CONTRAST TECHNIQUE: Multidetector CT imaging of the neck was performed using the standard protocol following the bolus administration of intravenous contrast. CONTRAST:  56mL OMNIPAQUE IOHEXOL 300 MG/ML  SOLN COMPARISON:  None. FINDINGS: PHARYNX AND LARYNX: --Nasopharynx: Fossae of Rosenmuller are clear. Normal adenoid tonsils for age. --Oral cavity and oropharynx: The palatine and lingual tonsils are normal. The visible oral cavity and floor of mouth are normal. --Hypopharynx: Normal vallecula and pyriform sinuses. --Larynx: Normal epiglottis and pre-epiglottic space. Normal  aryepiglottic and vocal folds. --Retropharyngeal space: No abscess, effusion or lymphadenopathy. SALIVARY GLANDS: --Parotid: No mass lesion or inflammation. No sialolithiasis or ductal dilatation. --Submandibular: Symmetric without inflammation. No sialolithiasis or ductal dilatation. --Sublingual: Normal. No ranula or other visible lesion of the base of tongue and floor of mouth. THYROID: Large right thyroid nodule measures 18 x 23 mm. LYMPH NODES: No enlarged or abnormal density lymph nodes. VASCULAR: Major cervical vessels are patent. LIMITED INTRACRANIAL: Normal. VISUALIZED ORBITS: Normal. MASTOIDS AND VISUALIZED PARANASAL SINUSES: No fluid levels or advanced mucosal thickening. No mastoid effusion. SKELETON: No bony spinal canal stenosis. No lytic or blastic lesions. UPPER CHEST: Clear. OTHER: None. IMPRESSION: 1. No soft tissue mass of the pharynx or larynx. Normal appearance of the vallecula. 2. No lymphadenopathy. 3. Large right thyroid nodule, measuring 2.3 x 1.8 cm. Dedicated thyroid  ultrasound is recommended for further evaluation on a nonemergent basis. Electronically Signed   By: Ulyses Jarred M.D.   On: 07/22/2018 22:36   Ct Angio Chest Pe W And/or Wo Contrast  Result Date: 07/22/2018 CLINICAL DATA:  Cough with blood. Shortness of breath. Chest pain for 3 days. History of excision of a vallecular mass and 04/2018. EXAM: CT ANGIOGRAPHY CHEST WITH CONTRAST TECHNIQUE: Multidetector CT imaging of the chest was performed using the standard protocol during bolus administration of intravenous contrast. Multiplanar CT image reconstructions and MIPs were obtained to evaluate the vascular anatomy. CONTRAST:  76mL ISOVUE-370 IOPAMIDOL (ISOVUE-370) INJECTION 76% COMPARISON:  None. FINDINGS: Cardiovascular: There is good opacification of the central and segmental pulmonary arteries. No focal filling defects. No evidence of significant pulmonary embolus. Normal heart size. No pericardial effusion. Normal caliber  thoracic aorta. Great vessel origins are patent. Mediastinum/Nodes: Low-attenuation nodule in the right thyroid gland measuring 2.6 cm diameter. No significant lymphadenopathy in the chest. Esophagus is decompressed. Small esophageal hiatal hernia. Lungs/Pleura: Mild dependent changes in the lung bases. No airspace disease or consolidation in the lungs. No pleural effusions. No pneumothorax. Airways are patent. Upper Abdomen: Diffuse fatty infiltration of the liver. Musculoskeletal: Degenerative changes in the spine. No destructive bone lesions. Review of the MIP images confirms the above findings. IMPRESSION: 1. No evidence of significant pulmonary embolus. No evidence of active pulmonary disease. 2. 2.6 cm diameter asymmetric right thyroid gland nodule. Consider further evaluation with thyroid ultrasound. If patient is clinically hyperthyroid, consider nuclear medicine thyroid uptake and scan. Electronically Signed   By: Lucienne Capers M.D.   On: 07/22/2018 22:32    Procedures Procedures (including critical care time)  Medications Ordered in ED Medications  iopamidol (ISOVUE-370) 76 % injection 80 mL (80 mLs Intravenous Contrast Given 07/22/18 2144)  iohexol (OMNIPAQUE) 300 MG/ML solution 50 mL (50 mLs Intravenous Contrast Given 07/22/18 2151)     Initial Impression / Assessment and Plan / ED Course  I have reviewed the triage vital signs and the nursing notes.  Pertinent labs & imaging results that were available during my care of the patient were reviewed by me and considered in my medical decision making (see chart for details).     Patient presents with chest pain, tachycardia, coughing, throat irritation.  She appears in no acute distress.  Potassium 3.2.  CT of soft tissue neck and CT angio chest showed no acute findings.  A right thyroid nodule was noted.  During her emergency course, patient's heart rate decreased.  She appeared in no acute distress at discharge.  Discussed findings of  all tests including thyroid nodule.  She will follow-up with her primary care doctor for a thyroid ultrasound and further evaluation.  Discharge medications albuterol inhaler and prednisone.  Final Clinical Impressions(s) / ED Diagnoses   Final diagnoses:  Cough  Thyroid nodule    ED Discharge Orders        Ordered    albuterol (PROVENTIL HFA;VENTOLIN HFA) 108 (90 Base) MCG/ACT inhaler  Every 6 hours PRN     07/22/18 2258    predniSONE (DELTASONE) 10 MG tablet  Daily     07/22/18 2258       Nat Christen, MD 07/23/18 1332

## 2018-07-22 NOTE — Discharge Instructions (Addendum)
You have a nodule on your right thyroid.  Recommend an ultrasound of thyroid gland.  This can be organized by your primary care doctor.  Because of your allergies, I do not recommend a prescription cough syrup.  Use over-the-counter cough medication.  Also prescription for prednisone and albuterol inhaler.

## 2018-07-22 NOTE — ED Notes (Signed)
EKG given to Dr. Long 

## 2018-07-22 NOTE — ED Triage Notes (Signed)
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 When she arrived her HR was 130. Chest was hurting some as well. In My pt had a cyst removed and has had a dry hacking cough since then. History of an ablation to heart in January of 2016. States she usually has a high HR, but it's usually not this high.

## 2018-07-26 DIAGNOSIS — R05 Cough: Secondary | ICD-10-CM | POA: Diagnosis not present

## 2018-07-26 DIAGNOSIS — J209 Acute bronchitis, unspecified: Secondary | ICD-10-CM | POA: Diagnosis not present

## 2018-07-26 DIAGNOSIS — E041 Nontoxic single thyroid nodule: Secondary | ICD-10-CM | POA: Diagnosis not present

## 2018-07-28 ENCOUNTER — Other Ambulatory Visit (HOSPITAL_COMMUNITY): Payer: Self-pay | Admitting: Family Medicine

## 2018-07-28 DIAGNOSIS — E041 Nontoxic single thyroid nodule: Secondary | ICD-10-CM

## 2018-08-03 ENCOUNTER — Ambulatory Visit (HOSPITAL_COMMUNITY)
Admission: RE | Admit: 2018-08-03 | Discharge: 2018-08-03 | Disposition: A | Discharge status: Home / Self Care | Payer: BLUE CROSS/BLUE SHIELD | Source: Ambulatory Visit | Attending: Family Medicine | Admitting: Family Medicine

## 2018-08-03 DIAGNOSIS — E041 Nontoxic single thyroid nodule: Secondary | ICD-10-CM | POA: Diagnosis not present

## 2018-08-16 ENCOUNTER — Ambulatory Visit (INDEPENDENT_AMBULATORY_CARE_PROVIDER_SITE_OTHER): Payer: BLUE CROSS/BLUE SHIELD | Admitting: Otolaryngology

## 2018-08-16 DIAGNOSIS — D440 Neoplasm of uncertain behavior of thyroid gland: Secondary | ICD-10-CM

## 2018-08-16 DIAGNOSIS — K219 Gastro-esophageal reflux disease without esophagitis: Secondary | ICD-10-CM

## 2018-08-16 DIAGNOSIS — R490 Dysphonia: Secondary | ICD-10-CM

## 2018-08-17 ENCOUNTER — Other Ambulatory Visit (INDEPENDENT_AMBULATORY_CARE_PROVIDER_SITE_OTHER): Payer: Self-pay | Admitting: Otolaryngology

## 2018-08-17 DIAGNOSIS — E041 Nontoxic single thyroid nodule: Secondary | ICD-10-CM

## 2018-09-08 ENCOUNTER — Encounter (HOSPITAL_COMMUNITY): Payer: Self-pay

## 2018-09-08 ENCOUNTER — Ambulatory Visit (HOSPITAL_COMMUNITY)
Admission: RE | Admit: 2018-09-08 | Discharge: 2018-09-08 | Disposition: A | Discharge status: Home / Self Care | Payer: BLUE CROSS/BLUE SHIELD | Source: Ambulatory Visit | Attending: Otolaryngology | Admitting: Otolaryngology

## 2018-09-08 DIAGNOSIS — E041 Nontoxic single thyroid nodule: Secondary | ICD-10-CM | POA: Diagnosis not present

## 2018-09-08 MED ORDER — LIDOCAINE HCL (PF) 2 % IJ SOLN
INTRAMUSCULAR | Status: AC
  Administered 2018-09-08: 5 mL
  Filled 2018-09-08: qty 10

## 2018-12-09 ENCOUNTER — Other Ambulatory Visit (HOSPITAL_COMMUNITY): Payer: Self-pay | Admitting: Adult Health

## 2018-12-09 ENCOUNTER — Ambulatory Visit (HOSPITAL_COMMUNITY)
Admission: RE | Admit: 2018-12-09 | Discharge: 2018-12-09 | Disposition: A | Discharge status: Home / Self Care | Payer: BLUE CROSS/BLUE SHIELD | Source: Ambulatory Visit | Attending: Adult Health | Admitting: Adult Health

## 2018-12-09 DIAGNOSIS — M25512 Pain in left shoulder: Secondary | ICD-10-CM | POA: Diagnosis not present

## 2018-12-09 DIAGNOSIS — M19012 Primary osteoarthritis, left shoulder: Secondary | ICD-10-CM | POA: Diagnosis not present

## 2018-12-09 DIAGNOSIS — M13812 Other specified arthritis, left shoulder: Secondary | ICD-10-CM | POA: Diagnosis not present

## 2018-12-14 DIAGNOSIS — E669 Obesity, unspecified: Secondary | ICD-10-CM | POA: Diagnosis not present

## 2018-12-14 DIAGNOSIS — Z719 Counseling, unspecified: Secondary | ICD-10-CM | POA: Diagnosis not present

## 2018-12-14 DIAGNOSIS — Z6841 Body Mass Index (BMI) 40.0 and over, adult: Secondary | ICD-10-CM | POA: Diagnosis not present

## 2018-12-14 DIAGNOSIS — Z008 Encounter for other general examination: Secondary | ICD-10-CM | POA: Diagnosis not present

## 2019-01-06 DIAGNOSIS — Z719 Counseling, unspecified: Secondary | ICD-10-CM | POA: Diagnosis not present

## 2019-01-06 DIAGNOSIS — Z6841 Body Mass Index (BMI) 40.0 and over, adult: Secondary | ICD-10-CM | POA: Diagnosis not present

## 2019-01-06 DIAGNOSIS — Z008 Encounter for other general examination: Secondary | ICD-10-CM | POA: Diagnosis not present

## 2019-01-06 DIAGNOSIS — E669 Obesity, unspecified: Secondary | ICD-10-CM | POA: Diagnosis not present

## 2019-01-18 ENCOUNTER — Other Ambulatory Visit (HOSPITAL_COMMUNITY): Payer: Self-pay | Admitting: Internal Medicine

## 2019-01-18 ENCOUNTER — Other Ambulatory Visit (HOSPITAL_COMMUNITY): Payer: Self-pay | Admitting: Respiratory Therapy

## 2019-01-18 DIAGNOSIS — N926 Irregular menstruation, unspecified: Secondary | ICD-10-CM | POA: Diagnosis not present

## 2019-01-18 DIAGNOSIS — R05 Cough: Secondary | ICD-10-CM

## 2019-01-18 DIAGNOSIS — I471 Supraventricular tachycardia: Secondary | ICD-10-CM | POA: Diagnosis not present

## 2019-01-18 DIAGNOSIS — E041 Nontoxic single thyroid nodule: Secondary | ICD-10-CM | POA: Diagnosis not present

## 2019-01-18 DIAGNOSIS — Z1231 Encounter for screening mammogram for malignant neoplasm of breast: Secondary | ICD-10-CM

## 2019-01-18 DIAGNOSIS — R053 Chronic cough: Secondary | ICD-10-CM

## 2019-01-20 DIAGNOSIS — R05 Cough: Secondary | ICD-10-CM | POA: Diagnosis not present

## 2019-01-20 DIAGNOSIS — R0602 Shortness of breath: Secondary | ICD-10-CM | POA: Diagnosis not present

## 2019-01-24 ENCOUNTER — Ambulatory Visit (HOSPITAL_COMMUNITY)
Admission: RE | Admit: 2019-01-24 | Discharge: 2019-01-24 | Disposition: A | Discharge status: Home / Self Care | Payer: BLUE CROSS/BLUE SHIELD | Source: Ambulatory Visit | Attending: Adult Health Nurse Practitioner | Admitting: Adult Health Nurse Practitioner

## 2019-01-24 ENCOUNTER — Other Ambulatory Visit (HOSPITAL_COMMUNITY): Payer: Self-pay | Admitting: Adult Health Nurse Practitioner

## 2019-01-24 ENCOUNTER — Ambulatory Visit (HOSPITAL_COMMUNITY): Payer: BLUE CROSS/BLUE SHIELD

## 2019-01-24 DIAGNOSIS — R0602 Shortness of breath: Secondary | ICD-10-CM

## 2019-01-24 DIAGNOSIS — R05 Cough: Secondary | ICD-10-CM | POA: Diagnosis not present

## 2019-01-26 ENCOUNTER — Other Ambulatory Visit (HOSPITAL_COMMUNITY): Payer: Self-pay | Admitting: Internal Medicine

## 2019-01-26 DIAGNOSIS — I471 Supraventricular tachycardia: Secondary | ICD-10-CM | POA: Diagnosis not present

## 2019-01-26 DIAGNOSIS — Z13220 Encounter for screening for lipoid disorders: Secondary | ICD-10-CM | POA: Diagnosis not present

## 2019-01-26 DIAGNOSIS — R609 Edema, unspecified: Secondary | ICD-10-CM

## 2019-01-27 ENCOUNTER — Ambulatory Visit (HOSPITAL_COMMUNITY)
Admission: RE | Admit: 2019-01-27 | Discharge: 2019-01-27 | Disposition: A | Discharge status: Home / Self Care | Payer: BLUE CROSS/BLUE SHIELD | Source: Ambulatory Visit | Attending: Internal Medicine | Admitting: Internal Medicine

## 2019-01-27 DIAGNOSIS — R609 Edema, unspecified: Secondary | ICD-10-CM | POA: Diagnosis not present

## 2019-01-27 NOTE — Progress Notes (Signed)
*  PRELIMINARY RESULTS* Echocardiogram 2D Echocardiogram has been performed.  Samuel Germany 01/27/2019, 10:27 AM

## 2019-02-01 DIAGNOSIS — R05 Cough: Secondary | ICD-10-CM | POA: Diagnosis not present

## 2019-02-01 DIAGNOSIS — Z0000 Encounter for general adult medical examination without abnormal findings: Secondary | ICD-10-CM | POA: Diagnosis not present

## 2019-02-01 DIAGNOSIS — I5033 Acute on chronic diastolic (congestive) heart failure: Secondary | ICD-10-CM | POA: Diagnosis not present

## 2019-02-01 DIAGNOSIS — R000 Tachycardia, unspecified: Secondary | ICD-10-CM | POA: Diagnosis not present

## 2019-02-04 ENCOUNTER — Telehealth: Payer: Self-pay | Admitting: Cardiology

## 2019-02-04 ENCOUNTER — Ambulatory Visit (INDEPENDENT_AMBULATORY_CARE_PROVIDER_SITE_OTHER): Payer: BLUE CROSS/BLUE SHIELD | Admitting: Cardiology

## 2019-02-04 ENCOUNTER — Encounter: Payer: Self-pay | Admitting: Cardiology

## 2019-02-04 ENCOUNTER — Other Ambulatory Visit: Payer: Self-pay

## 2019-02-04 ENCOUNTER — Other Ambulatory Visit: Payer: Self-pay | Admitting: Cardiology

## 2019-02-04 ENCOUNTER — Encounter: Payer: Self-pay | Admitting: *Deleted

## 2019-02-04 ENCOUNTER — Other Ambulatory Visit: Payer: Self-pay | Admitting: *Deleted

## 2019-02-04 VITALS — BP 128/84 | HR 90 | Ht 63.0 in | Wt 244.2 lb

## 2019-02-04 DIAGNOSIS — I471 Supraventricular tachycardia, unspecified: Secondary | ICD-10-CM

## 2019-02-04 DIAGNOSIS — I5021 Acute systolic (congestive) heart failure: Secondary | ICD-10-CM

## 2019-02-04 DIAGNOSIS — Z01810 Encounter for preprocedural cardiovascular examination: Secondary | ICD-10-CM | POA: Diagnosis not present

## 2019-02-04 MED ORDER — SACUBITRIL-VALSARTAN 24-26 MG PO TABS: 1.0000 | ORAL_TABLET | Freq: Two times a day (BID) | ORAL | 3 refills | Status: DC

## 2019-02-04 MED ORDER — CARVEDILOL 6.25 MG PO TABS: 6.2500 mg | ORAL_TABLET | Freq: Two times a day (BID) | ORAL | 3 refills | Status: DC

## 2019-02-04 MED ORDER — FUROSEMIDE 40 MG PO TABS: 40.0000 mg | ORAL_TABLET | Freq: Every day | ORAL | 3 refills | Status: DC

## 2019-02-04 NOTE — Progress Notes (Signed)
Clinical Summary Margaret [name] is a 43 y.o.female seen today for follow up of the following medical problems.   1. Acute systolic HF - several month history of progressing SOB/DOE, cough, orthopnea. No significant LE edema. Has had some adbomdinal distension - Jan 2020 echo ordered by pcp showed LVEF 15-20%, restricitive diastolic function, mild RV dysfunction.  - started on coreg long acting by pcp - on lasix 20mg  x 1 month. Without much improvement - ongoing orthopnea.   - no EtOH, no drug use.  - Father with MI in early 39s, no other family history of heart disease. Maternal grandfather early 46s had MI. No significant FH of heart failure per her report.     2. PSVT - s/p ablation Jan 2017 by Dr Lovena Le - no recent palpitations.    3. 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 SVT (supraventricular tachycardia) (HCC)      Allergies  Allergen Reactions  . Codeine Hives and Itching  . Sulfa Antibiotics Hives     Current Outpatient Medications  Medication Sig Dispense Refill  . acetaminophen (TYLENOL) 500 MG tablet Take 1,000 mg by mouth every 6 (six) hours as needed for mild pain or headache.    . albuterol (PROVENTIL HFA;VENTOLIN HFA) 108 (90 Base) MCG/ACT inhaler Inhale 1-2 puffs into the lungs every 6 (six) hours as needed for wheezing or shortness of breath. 1 Inhaler 0  . predniSONE (DELTASONE) 10 MG tablet Take 2 tablets (20 mg total) by mouth daily. 15 tablet 0   No current facility-administered medications for this visit.      Past Surgical History:  Procedure Laterality Date  . APPENDECTOMY  1981  . CESAREAN SECTION  1999; 2007  . ELECTROPHYSIOLOGIC STUDY N/A 01/09/2016   Procedure: SVT Ablation;  Surgeon: Evans Lance, MD;  Location: [facility];  Service: Cardiovascular;  Laterality: N/A;  . LAPAROSCOPIC CHOLECYSTECTOMY  1998  . MICROLARYNGOSCOPY Left 04/26/2018   Procedure: MICROLARYNGOSCOPY WITH EXCISION OF  VALLECULER MASS;  Surgeon: Leta Baptist, MD;  Location: [facility];  Service: ENT;  Laterality: Left;  . SUPRAVENTRICULAR TACHYCARDIA ABLATION  01/09/2016  . TUBAL LIGATION  2007     Allergies  Allergen Reactions  . Codeine Hives and Itching  . Sulfa Antibiotics Hives      Family History  Problem Relation Age of Onset  . Hypertension Mother   . Heart failure Father   . Hypertension Father   . Stroke Father      Social History Ms. Sahagun reports that she has never smoked. She has never used smokeless tobacco. Ms. Degrasse reports no history of alcohol use.   Review of Systems CONSTITUTIONAL: No weight loss, fever, chills, weakness or fatigue.  HEENT: Eyes: No visual loss, blurred vision, double vision or yellow sclerae.No hearing loss, sneezing, congestion, runny nose or sore throat.  SKIN: No rash or itching.  CARDIOVASCULAR: RRR, no m/r/g, no jvd RESPIRATORY: No shortness of breath, cough or sputum.  GASTROINTESTINAL: No anorexia, nausea, vomiting or diarrhea. No abdominal pain or blood.  GENITOURINARY: No burning on urination, no polyuria NEUROLOGICAL: No headache, dizziness, syncope, paralysis, ataxia, numbness or tingling in the extremities. No change in bowel or bladder control.  MUSCULOSKELETAL: No muscle, back pain, joint pain or stiffness.  LYMPHATICS: No enlarged nodes. No history of splenectomy.  PSYCHIATRIC: No history of depression or anxiety.  ENDOCRINOLOGIC: No reports of sweating, cold or heat intolerance. No polyuria  or polydipsia.  Marland Kitchen   Physical Examination Vitals:   02/04/19 1129  BP: 128/84  Pulse: 90  SpO2: 97%   Vitals:   02/04/19 1129  Weight: 244 lb 3.2 oz (110.8 kg)  Height: 5\' 3"  (1.6 m)    Gen: resting comfortably, no acute distress HEENT: no scleral icterus, pupils equal round and reactive, no palptable cervical adenopathy,  CV: RRR, no m/r/g, no jvd Resp: Clear to auscultation bilaterally GI: abdomen is soft,  non-tender, non-distended, normal bowel sounds, no hepatosplenomegaly MSK: extremities are warm, no edema.  Skin: warm, no rash Neuro:  no focal deficits Psych: appropriate affect     Assessment and Plan  1. Acute systolic HF - new diagnosis of HF, echo showed LVEF 15-20% - exam is limited by body habitus. Ongoing DOE and orthopnea. Increase lasix to 40mg  daily - change long acting coreg to short acting 6.25mg  bid. Start entresto 24/26mg  bid - arrange RHC/LHC next week. Add TSH to precath labs - if not ICM consider holter to quantify her pcv burden  2. PSVT - s/p ablation, no recent symptoms  3. PVCs - noted on prior EKGs - if negative cath would plan for 24 hour holter to quantify PVC burden  F/u 3 weeks  Arnoldo Lenis, M.D.

## 2019-02-04 NOTE — Patient Instructions (Addendum)
Medication Instructions:   Your physician has recommended you make the following change in your medication:   Stop coreg 10 mg  Start carvedilol 6.25 mg by mouth twice daily  Start entresto 24/26 mg by mouth twice daily-voucher given for first 30 day free  Increase furosemide to 40 mg by mouth daily. You may take (2) of your 20 mg tablets daily until they are finished  Continue all other medications the same.  Labwork:  Your physician recommends that you return for lab work in: 2 weeks to check your BMET and Mg levels. Lab orders faxed to [facility].     Testing/Procedures: Your physician has requested that you have a cardiac catheterization. Cardiac catheterization is used to diagnose and/or treat various heart conditions. Doctors may recommend this procedure for a number of different reasons. The most common reason is to evaluate chest pain. Chest pain can be a symptom of coronary artery disease (CAD), and cardiac catheterization can show whether plaque is narrowing or blocking your heart's arteries. This procedure is also used to evaluate the valves, as well as measure the blood flow and oxygen levels in different parts of your heart. For further information please visit [email or web address]. Please follow instruction sheet, as given.  Follow-Up:  Your physician recommends that you schedule a follow-up appointment in: 3 weeks.  Any Other Special Instructions Will Be Listed Below (If Applicable).  If you need a refill on your cardiac medications before your next appointment, please call your pharmacy.     [place] [address] Dept: [phone] Loc: [place]  02/04/2019  You are scheduled for a Cardiac Catheterization on Wednesday, February 12 with Dr. Peter Martinique.  1. Please arrive at the [facility] (Main Entrance A) at  [facility]: [address] [place], [place] [address] at 9:30 AM (This time is two hours before your procedure to ensure your preparation). Free valet parking service is available.   Special note: Every effort is made to have your procedure done on time. Please understand that emergencies sometimes delay scheduled procedures.  2. Diet: Do not eat solid foods after midnight.  The patient may have clear liquids until 5am upon the day of the procedure.  3. Lab work done on 01/26/2019 and scanned into chart. 4. Medication instructions in preparation for your procedure: Please hold your furosemide, potassium on the morning of your cath.   Contrast Allergy: No  On the morning of your procedure, take your Aspirin 81 mg and any morning medicines NOT listed above.  You may use sips of water.  5. Plan for one night stay--bring personal belongings. 6. Bring a current list of your medications and current insurance cards. 7. You MUST have a responsible person to drive you home. 8. Someone MUST be with you the first 24 hours after you arrive home or your discharge will be delayed. 9. Please wear clothes that are easy to get on and off and wear slip-on shoes.  Thank you for allowing [place] to care for you!    -- [facility]

## 2019-02-04 NOTE — Telephone Encounter (Signed)
°  Precert needed for:   Right and Left heart cath dx: acute systolic heart failure on Wednesday, February 09, 2019 @11 :30 am arrive at 9:30 am with Dr. Martinique

## 2019-02-04 NOTE — Addendum Note (Signed)
Addended by: Merlene Laughter on: 02/04/2019 01:08 PM   Modules accepted: Orders

## 2019-02-04 NOTE — H&P (View-Only) (Signed)
Clinical Summary Ms. Lambert is a 43 y.o.female seen today for follow up of the following medical problems.   1. Acute systolic HF - several month history of progressing SOB/DOE, cough, orthopnea. No significant LE edema. Has had some adbomdinal distension - Jan 2020 echo ordered by pcp showed LVEF 15-20%, restricitive diastolic function, mild RV dysfunction.  - started on coreg long acting by pcp - on lasix 20mg  x 1 month. Without much improvement - ongoing orthopnea.   - no EtOH, no drug use.  - Father with MI in early 42s, no other family history of heart disease. Maternal grandfather early 80s had MI. No significant FH of heart failure per her report.     2. PSVT - s/p ablation Jan 2017 by Dr Lovena Le - no recent palpitations.    3. 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 SVT (supraventricular tachycardia) (HCC)      Allergies  Allergen Reactions  . Codeine Hives and Itching  . Sulfa Antibiotics Hives     Current Outpatient Medications  Medication Sig Dispense Refill  . acetaminophen (TYLENOL) 500 MG tablet Take 1,000 mg by mouth every 6 (six) hours as needed for mild pain or headache.    . albuterol (PROVENTIL HFA;VENTOLIN HFA) 108 (90 Base) MCG/ACT inhaler Inhale 1-2 puffs into the lungs every 6 (six) hours as needed for wheezing or shortness of breath. 1 Inhaler 0  . predniSONE (DELTASONE) 10 MG tablet Take 2 tablets (20 mg total) by mouth daily. 15 tablet 0   No current facility-administered medications for this visit.      Past Surgical History:  Procedure Laterality Date  . APPENDECTOMY  1981  . CESAREAN SECTION  1999; 2007  . ELECTROPHYSIOLOGIC STUDY N/A 01/09/2016   Procedure: SVT Ablation;  Surgeon: Evans Lance, MD;  Location: [facility];  Service: Cardiovascular;  Laterality: N/A;  . LAPAROSCOPIC CHOLECYSTECTOMY  1998  . MICROLARYNGOSCOPY Left 04/26/2018   Procedure: MICROLARYNGOSCOPY WITH EXCISION OF  VALLECULER MASS;  Surgeon: Leta Baptist, MD;  Location: [facility];  Service: ENT;  Laterality: Left;  . SUPRAVENTRICULAR TACHYCARDIA ABLATION  01/09/2016  . TUBAL LIGATION  2007     Allergies  Allergen Reactions  . Codeine Hives and Itching  . Sulfa Antibiotics Hives      Family History  Problem Relation Age of Onset  . Hypertension Mother   . Heart failure Father   . Hypertension Father   . Stroke Father      Social History Ms. Viveiros reports that she has never smoked. She has never used smokeless tobacco. Ms. Posas reports no history of alcohol use.   Review of Systems CONSTITUTIONAL: No weight loss, fever, chills, weakness or fatigue.  HEENT: Eyes: No visual loss, blurred vision, double vision or yellow sclerae.No hearing loss, sneezing, congestion, runny nose or sore throat.  SKIN: No rash or itching.  CARDIOVASCULAR: RRR, no m/r/g, no jvd RESPIRATORY: No shortness of breath, cough or sputum.  GASTROINTESTINAL: No anorexia, nausea, vomiting or diarrhea. No abdominal pain or blood.  GENITOURINARY: No burning on urination, no polyuria NEUROLOGICAL: No headache, dizziness, syncope, paralysis, ataxia, numbness or tingling in the extremities. No change in bowel or bladder control.  MUSCULOSKELETAL: No muscle, back pain, joint pain or stiffness.  LYMPHATICS: No enlarged nodes. No history of splenectomy.  PSYCHIATRIC: No history of depression or anxiety.  ENDOCRINOLOGIC: No reports of sweating, cold or heat intolerance. No polyuria  or polydipsia.  Marland Kitchen   Physical Examination Vitals:   02/04/19 1129  BP: 128/84  Pulse: 90  SpO2: 97%   Vitals:   02/04/19 1129  Weight: 244 lb 3.2 oz (110.8 kg)  Height: 5\' 3"  (1.6 m)    Gen: resting comfortably, no acute distress HEENT: no scleral icterus, pupils equal round and reactive, no palptable cervical adenopathy,  CV: RRR, no m/r/g, no jvd Resp: Clear to auscultation bilaterally GI: abdomen is soft,  non-tender, non-distended, normal bowel sounds, no hepatosplenomegaly MSK: extremities are warm, no edema.  Skin: warm, no rash Neuro:  no focal deficits Psych: appropriate affect     Assessment and Plan  1. Acute systolic HF - new diagnosis of HF, echo showed LVEF 15-20% - exam is limited by body habitus. Ongoing DOE and orthopnea. Increase lasix to 40mg  daily - change long acting coreg to short acting 6.25mg  bid. Start entresto 24/26mg  bid - arrange RHC/LHC next week. Add TSH to precath labs - if not ICM consider holter to quantify her pcv burden  2. PSVT - s/p ablation, no recent symptoms  3. PVCs - noted on prior EKGs - if negative cath would plan for 24 hour holter to quantify PVC burden  F/u 3 weeks  Arnoldo Lenis, M.D.

## 2019-02-08 ENCOUNTER — Telehealth: Payer: Self-pay | Admitting: *Deleted

## 2019-02-08 NOTE — Telephone Encounter (Signed)
Follow up  PT is at work, Husband is calling back to return the missed call. Please call him at [phone]

## 2019-02-08 NOTE — Telephone Encounter (Signed)
Pt contacted pre-catheterization scheduled at [facility] for: Wednesday February 09, 2019 11:30 AM Verified arrival time and place: [place] Entrance A at: 9:00 AM  No solid food after midnight prior to cath, clear liquids until 5 AM day of procedure.  Hold: Furosemide-AM of procedure. KCl-AM of procedure.  Except hold medications AM meds can be  taken pre-cath with sip of water including: ASA 81 mg  Confirm patient has responsible person to drive home post procedure and observe 24 hours after arriving home.  Attempted to contact patient by telephone,  did not leave message.

## 2019-02-08 NOTE — Telephone Encounter (Signed)
I spoke with patient's husband (DPR) and reviewed instructions, he verbalized understanding, thanked me for call.

## 2019-02-09 ENCOUNTER — Ambulatory Visit (HOSPITAL_COMMUNITY)
Admission: RE | Admit: 2019-02-09 | Discharge: 2019-02-09 | Disposition: A | Discharge status: Home / Self Care | Payer: BLUE CROSS/BLUE SHIELD | Attending: Cardiology | Admitting: Cardiology

## 2019-02-09 ENCOUNTER — Encounter (HOSPITAL_COMMUNITY)
Admission: RE | Disposition: A | Discharge status: Home / Self Care | Payer: Self-pay | Source: Home / Self Care | Attending: Cardiology

## 2019-02-09 ENCOUNTER — Other Ambulatory Visit: Payer: Self-pay

## 2019-02-09 DIAGNOSIS — Z882 Allergy status to sulfonamides status: Secondary | ICD-10-CM | POA: Diagnosis not present

## 2019-02-09 DIAGNOSIS — I5021 Acute systolic (congestive) heart failure: Secondary | ICD-10-CM

## 2019-02-09 DIAGNOSIS — Z79899 Other long term (current) drug therapy: Secondary | ICD-10-CM | POA: Insufficient documentation

## 2019-02-09 DIAGNOSIS — Z8249 Family history of ischemic heart disease and other diseases of the circulatory system: Secondary | ICD-10-CM | POA: Diagnosis not present

## 2019-02-09 DIAGNOSIS — I493 Ventricular premature depolarization: Secondary | ICD-10-CM | POA: Insufficient documentation

## 2019-02-09 DIAGNOSIS — Z885 Allergy status to narcotic agent status: Secondary | ICD-10-CM | POA: Insufficient documentation

## 2019-02-09 DIAGNOSIS — I471 Supraventricular tachycardia, unspecified: Secondary | ICD-10-CM | POA: Diagnosis present

## 2019-02-09 HISTORY — PX: RIGHT/LEFT HEART CATH AND CORONARY ANGIOGRAPHY: CATH118266

## 2019-02-09 LAB — POCT I-STAT EG7
ACID-BASE EXCESS: 1 mmol/L (ref 0.0–2.0)
Bicarbonate: 26.7 mmol/L (ref 20.0–28.0)
Calcium, Ion: 1.25 mmol/L (ref 1.15–1.40)
HCT: 38 % (ref 36.0–46.0)
Hemoglobin: 12.9 g/dL (ref 12.0–15.0)
O2 SAT: 68 %
Potassium: 3.7 mmol/L (ref 3.5–5.1)
Sodium: 143 mmol/L (ref 135–145)
TCO2: 28 mmol/L (ref 22–32)
pCO2, Ven: 46.8 mmHg (ref 44.0–60.0)
pH, Ven: 7.365 (ref 7.250–7.430)
pO2, Ven: 37 mmHg (ref 32.0–45.0)

## 2019-02-09 LAB — POCT I-STAT 7, (LYTES, BLD GAS, ICA,H+H)
Bicarbonate: 25.4 mmol/L (ref 20.0–28.0)
Calcium, Ion: 1.23 mmol/L (ref 1.15–1.40)
HCT: 37 % (ref 36.0–46.0)
Hemoglobin: 12.6 g/dL (ref 12.0–15.0)
O2 Saturation: 95 %
PO2 ART: 80 mmHg — AB (ref 83.0–108.0)
Potassium: 3.7 mmol/L (ref 3.5–5.1)
Sodium: 143 mmol/L (ref 135–145)
TCO2: 27 mmol/L (ref 22–32)
pCO2 arterial: 42.4 mmHg (ref 32.0–48.0)
pH, Arterial: 7.384 (ref 7.350–7.450)

## 2019-02-09 LAB — HCG, SERUM, QUALITATIVE: Preg, Serum: NEGATIVE

## 2019-02-09 SURGERY — RIGHT/LEFT HEART CATH AND CORONARY ANGIOGRAPHY
Anesthesia: LOCAL

## 2019-02-09 MED ORDER — LIDOCAINE HCL (PF) 1 % IJ SOLN
INTRAMUSCULAR | Status: AC
  Filled 2019-02-09: qty 30

## 2019-02-09 MED ORDER — IOHEXOL 350 MG/ML SOLN
INTRAVENOUS | Status: DC | PRN
  Administered 2019-02-09: 75 mL via INTRA_ARTERIAL

## 2019-02-09 MED ORDER — VERAPAMIL HCL 2.5 MG/ML IV SOLN
INTRAVENOUS | Status: DC | PRN
  Administered 2019-02-09: 10 mL via INTRA_ARTERIAL

## 2019-02-09 MED ORDER — HEPARIN (PORCINE) IN NACL 1000-0.9 UT/500ML-% IV SOLN
INTRAVENOUS | Status: AC
  Filled 2019-02-09: qty 1000

## 2019-02-09 MED ORDER — SODIUM CHLORIDE 0.9% FLUSH: 3.0000 mL | INTRAVENOUS | Status: DC | PRN

## 2019-02-09 MED ORDER — HEPARIN (PORCINE) IN NACL 1000-0.9 UT/500ML-% IV SOLN
INTRAVENOUS | Status: DC | PRN
  Administered 2019-02-09 (×2): 500 mL

## 2019-02-09 MED ORDER — FENTANYL CITRATE (PF) 100 MCG/2ML IJ SOLN
INTRAMUSCULAR | Status: AC
  Filled 2019-02-09: qty 2

## 2019-02-09 MED ORDER — HEPARIN SODIUM (PORCINE) 1000 UNIT/ML IJ SOLN
INTRAMUSCULAR | Status: DC | PRN
  Administered 2019-02-09: 5000 [IU] via INTRAVENOUS

## 2019-02-09 MED ORDER — HEPARIN SODIUM (PORCINE) 1000 UNIT/ML IJ SOLN
INTRAMUSCULAR | Status: AC
  Filled 2019-02-09: qty 1

## 2019-02-09 MED ORDER — VERAPAMIL HCL 2.5 MG/ML IV SOLN
INTRAVENOUS | Status: AC
  Filled 2019-02-09: qty 2

## 2019-02-09 MED ORDER — ASPIRIN 81 MG PO CHEW: 81.0000 mg | CHEWABLE_TABLET | ORAL | Status: DC

## 2019-02-09 MED ORDER — SODIUM CHLORIDE 0.9% FLUSH: 3.0000 mL | Freq: Two times a day (BID) | INTRAVENOUS | Status: DC

## 2019-02-09 MED ORDER — LIDOCAINE HCL (PF) 1 % IJ SOLN
INTRAMUSCULAR | Status: DC | PRN
  Administered 2019-02-09 (×2): 2 mL via INTRADERMAL

## 2019-02-09 MED ORDER — SODIUM CHLORIDE 0.9 % IV SOLN: 250.0000 mL | INTRAVENOUS | Status: DC | PRN

## 2019-02-09 MED ORDER — MIDAZOLAM HCL 2 MG/2ML IJ SOLN
INTRAMUSCULAR | Status: AC
  Filled 2019-02-09: qty 2

## 2019-02-09 MED ORDER — SODIUM CHLORIDE 0.9 % IV SOLN: INTRAVENOUS | Status: DC

## 2019-02-09 MED ORDER — FENTANYL CITRATE (PF) 100 MCG/2ML IJ SOLN
INTRAMUSCULAR | Status: DC | PRN
  Administered 2019-02-09: 25 ug via INTRAVENOUS

## 2019-02-09 MED ORDER — MIDAZOLAM HCL 2 MG/2ML IJ SOLN
INTRAMUSCULAR | Status: DC | PRN
  Administered 2019-02-09: 1 mg via INTRAVENOUS

## 2019-02-09 SURGICAL SUPPLY — 12 items
CATH 5FR JL3.5 JR4 ANG PIG MP (CATHETERS) ×1 IMPLANT
CATH BALLN WEDGE 5F 110CM (CATHETERS) ×2 IMPLANT
DEVICE RAD COMP TR BAND LRG (VASCULAR PRODUCTS) ×1 IMPLANT
GLIDESHEATH SLEND SS 6F .021 (SHEATH) ×2 IMPLANT
GUIDEWIRE INQWIRE 1.5J.035X260 (WIRE) ×1 IMPLANT
INQWIRE 1.5J .035X260CM (WIRE) ×2
KIT HEART LEFT (KITS) ×2 IMPLANT
PACK CARDIAC CATHETERIZATION (CUSTOM PROCEDURE TRAY) ×2 IMPLANT
SHEATH GLIDE SLENDER 4/5FR (SHEATH) ×2 IMPLANT
SYR MEDRAD MARK 7 150ML (SYRINGE) ×2 IMPLANT
TRANSDUCER W/STOPCOCK (MISCELLANEOUS) ×2 IMPLANT
TUBING CIL FLEX 10 FLL-RA (TUBING) ×2 IMPLANT

## 2019-02-09 NOTE — Discharge Instructions (Signed)
Radial Site Care ° °This sheet gives you information about how to care for yourself after your procedure. Your health care provider may also give you more specific instructions. If you have problems or questions, contact your health care provider. °What can I expect after the procedure? °After the procedure, it is common to have: °· Bruising and tenderness at the catheter insertion area. °Follow these instructions at home: °Medicines °· Take over-the-counter and prescription medicines only as told by your health care provider. °Insertion site care °· Follow instructions from your health care provider about how to take care of your insertion site. Make sure you: °? Wash your hands with soap and water before you change your bandage (dressing). If soap and water are not available, use hand sanitizer. °? Change your dressing as told by your health care provider. °? Leave stitches (sutures), skin glue, or adhesive strips in place. These skin closures may need to stay in place for 2 weeks or longer. If adhesive strip edges start to loosen and curl up, you may trim the loose edges. Do not remove adhesive strips completely unless your health care provider tells you to do that. °· Check your insertion site every day for signs of infection. Check for: °? Redness, swelling, or pain. °? Fluid or blood. °? Pus or a bad smell. °? Warmth. °· Do not take baths, swim, or use a hot tub until your health care provider approves. °· You may shower 24-48 hours after the procedure, or as directed by your health care provider. °? Remove the dressing and gently wash the site with plain soap and water. °? Pat the area dry with a clean towel. °? Do not rub the site. That could cause bleeding. °· Do not apply powder or lotion to the site. °Activity ° °· For 24 hours after the procedure, or as directed by your health care provider: °? Do not flex or bend the affected arm. °? Do not push or pull heavy objects with the affected arm. °? Do not  drive yourself home from the hospital or clinic. You may drive 24 hours after the procedure unless your health care provider tells you not to. °? Do not operate machinery or power tools. °· Do not lift anything that is heavier than 10 lb (4.5 kg), or the limit that you are told, until your health care provider says that it is safe. °· Ask your health care provider when it is okay to: °? Return to work or school. °? Resume usual physical activities or sports. °? Resume sexual activity. °General instructions °· If the catheter site starts to bleed, raise your arm and put firm pressure on the site. If the bleeding does not stop, get help right away. This is a medical emergency. °· If you went home on the same day as your procedure, a responsible adult should be with you for the first 24 hours after you arrive home. °· Keep all follow-up visits as told by your health care provider. This is important. °Contact a health care provider if: °· You have a fever. °· You have redness, swelling, or yellow drainage around your insertion site. °Get help right away if: °· You have unusual pain at the radial site. °· The catheter insertion area swells very fast. °· The insertion area is bleeding, and the bleeding does not stop when you hold steady pressure on the area. °· Your arm or hand becomes pale, cool, tingly, or numb. °These symptoms may represent a serious problem   that is an emergency. Do not wait to see if the symptoms will go away. Get medical help right away. Call your local emergency services (911 in the [place] Do not drive yourself to the hospital. °Summary °· After the procedure, it is common to have bruising and tenderness at the site. °· Follow instructions from your health care provider about how to take care of your radial site wound. Check the wound every day for signs of infection. °· Do not lift anything that is heavier than 10 lb (4.5 kg), or the limit that you are told, until your health care provider says  that it is safe. °This information is not intended to replace advice given to you by your health care provider. Make sure you discuss any questions you have with your health care provider. °Document Released: 01/17/2011 Document Revised: 01/20/2018 Document Reviewed: 01/20/2018 °Elsevier Interactive Patient Education © 2019 Elsevier Inc[employer] ° ° ° °Moderate Conscious Sedation, Adult, Care After °These instructions provide you with information about caring for yourself after your procedure. Your health care provider may also give you more specific instructions. Your treatment has been planned according to current medical practices, but problems sometimes occur. Call your health care provider if you have any problems or questions after your procedure. °What can I expect after the procedure? °After your procedure, it is common: °· To feel sleepy for several hours. °· To feel clumsy and have poor balance for several hours. °· To have poor judgment for several hours. °· To vomit if you eat too soon. °Follow these instructions at home: °For at least 24 hours after the procedure: ° °· Do not: °? Participate in activities where you could fall or become injured. °? Drive. °? Use heavy machinery. °? Drink alcohol. °? Take sleeping pills or medicines that cause drowsiness. °? Make important decisions or sign legal documents. °? Take care of children on your own. °· Rest. °Eating and drinking °· Follow the diet recommended by your health care provider. °· If you vomit: °? Drink water, juice, or soup when you can drink without vomiting. °? Make sure you have little or no nausea before eating solid foods. °General instructions °· Have a responsible adult stay with you until you are awake and alert. °· Take over-the-counter and prescription medicines only as told by your health care provider. °· If you smoke, do not smoke without supervision. °· Keep all follow-up visits as told by your health care provider. This is  important. °Contact a health care provider if: °· You keep feeling nauseous or you keep vomiting. °· You feel light-headed. °· You develop a rash. °· You have a fever. °Get help right away if: °· You have trouble breathing. °This information is not intended to replace advice given to you by your health care provider. Make sure you discuss any questions you have with your health care provider. °Document Released: 10/05/2013 Document Revised: 05/19/2016 Document Reviewed: 04/05/2016 °Elsevier Interactive Patient Education © 2019 Elsevier Inc. ° °

## 2019-02-09 NOTE — Interval H&P Note (Signed)
History and Physical Interval Note:  02/09/2019 1:53 PM  Margaret [name] [facility]  has presented today for surgery, with the diagnosis of Systolic Heart Failure  The various methods of treatment have been discussed with the patient and family. After consideration of risks, benefits and other options for treatment, the patient has consented to  Procedure(s): RIGHT/LEFT HEART CATH AND CORONARY ANGIOGRAPHY (N/A) as a surgical intervention .  The patient's history has been reviewed, patient examined, no change in status, stable for surgery.  I have reviewed the patient's chart and labs.  Questions were answered to the patient's satisfaction.   Cath Lab Visit (complete for each Cath Lab visit)  Clinical Evaluation Leading to the Procedure:   ACS: No.  Non-ACS:    Anginal Classification: CCS I  Anti-ischemic medical therapy: Minimal Therapy (1 class of medications)  Non-Invasive Test Results: No non-invasive testing performed  Prior CABG: No previous CABG       Antiono Ettinger Martinique MD,FACC 02/09/2019 1:53 PM

## 2019-02-09 NOTE — Research (Signed)
[facility] Informed Consent   Subject Name: Margaret [name]  Subject met inclusion and exclusion criteria.  The informed consent form, study requirements and expectations were reviewed with the subject and questions and concerns were addressed prior to the signing of the consent form.  The subject verbalized understanding of the trail requirements.  The subject agreed to participate in the [facility] trial and signed the informed consent.  The informed consent was obtained prior to performance of any protocol-specific procedures for the subject.  A copy of the signed informed consent was given to the subject and a copy was placed in the subject's medical record.   TERRELL, AMY 02/09/2019, 11:56 AM

## 2019-02-09 NOTE — Progress Notes (Signed)
Called lab/ [facility] and asked why the serum hcg was still in a held pattern after release. Lab was waiting for more urine, pt unable and blood was sent. Serum to be ran.

## 2019-02-10 ENCOUNTER — Telehealth: Payer: Self-pay

## 2019-02-10 ENCOUNTER — Encounter (HOSPITAL_COMMUNITY): Payer: Self-pay | Admitting: Cardiology

## 2019-02-10 DIAGNOSIS — R079 Chest pain, unspecified: Secondary | ICD-10-CM

## 2019-02-10 DIAGNOSIS — I5022 Chronic systolic (congestive) heart failure: Secondary | ICD-10-CM

## 2019-02-10 NOTE — Progress Notes (Signed)
Heart cath showed no evidence of blockages. We need to keep looking for possible causes of her weakened heart muscle. I'd like to get a cardiac MRI to evaluate for any underlying disease of the heart muscle itself. Can we order a cardiac MRI for chronic systolic heart failure at [facility]

## 2019-02-10 NOTE — Telephone Encounter (Signed)
-----   Message from Arnoldo Lenis, MD sent at 02/10/2019  3:28 PM EST -----   ----- Message ----- From: Martinique, Peter M, MD Sent: 02/09/2019   3:05 PM EST To: Arnoldo Lenis, MD

## 2019-02-14 ENCOUNTER — Telehealth: Payer: Self-pay | Admitting: Cardiology

## 2019-02-14 MED ORDER — SACUBITRIL-VALSARTAN 24-26 MG PO TABS: 1.0000 | ORAL_TABLET | Freq: Two times a day (BID) | ORAL | 11 refills | Status: DC

## 2019-02-14 NOTE — Telephone Encounter (Signed)
New Message           *STAT* If patient is at the pharmacy, call can be transferred to refill team.   1. Which medications need to be refilled? (please list name of each medication and dose if known)   2. Which pharmacy/location (including street and city if local pharmacy) is medication to be sent to?  3. Do they need a 30 day or 90 day supply?

## 2019-02-14 NOTE — Telephone Encounter (Signed)
New Message          *STAT* If patient is at the pharmacy, call can be transferred to refill team.   1. Which medications need to be refilled? (please list name of each medication and dose if known) Entresto  2. Which pharmacy/location (including street and city if local pharmacy) is medication to be sent to? [place]   3. Do they need a 30 day or 90 day supply? [place]

## 2019-02-14 NOTE — Telephone Encounter (Signed)
Refilled entresto to [facility]

## 2019-02-17 DIAGNOSIS — I509 Heart failure, unspecified: Secondary | ICD-10-CM | POA: Diagnosis not present

## 2019-02-18 ENCOUNTER — Encounter: Payer: Self-pay | Admitting: *Deleted

## 2019-02-18 ENCOUNTER — Telehealth: Payer: Self-pay | Admitting: *Deleted

## 2019-02-18 NOTE — Telephone Encounter (Signed)
Left message regarding appointment for Cardiac MRI scheduled 03/10/19 at 3:00pm at [facility]--will also mail letter

## 2019-02-23 ENCOUNTER — Ambulatory Visit (HOSPITAL_COMMUNITY)
Admission: RE | Admit: 2019-02-23 | Discharge: 2019-02-23 | Disposition: A | Discharge status: Home / Self Care | Payer: BLUE CROSS/BLUE SHIELD | Source: Ambulatory Visit | Attending: Internal Medicine | Admitting: Internal Medicine

## 2019-02-23 DIAGNOSIS — R05 Cough: Secondary | ICD-10-CM | POA: Insufficient documentation

## 2019-02-23 DIAGNOSIS — R053 Chronic cough: Secondary | ICD-10-CM

## 2019-02-23 LAB — PULMONARY FUNCTION TEST
DL/VA % pred: 105 %
DL/VA: 4.67 ml/min/mmHg/L
DLCO cor % pred: 102 %
DLCO cor: 21.4 ml/min/mmHg
DLCO unc % pred: 101 %
DLCO unc: 21.06 ml/min/mmHg
FEF 25-75 Pre: 2.51 L/sec
FEF2575-%Pred-Pre: 85 %
FEV1-%PRED-PRE: 94 %
FEV1-PRE: 2.7 L
FEV1FVC-%Pred-Pre: 99 %
FEV6-%Pred-Pre: 96 %
FEV6-Pre: 3.34 L
FEV6FVC-%Pred-Pre: 102 %
FVC-%Pred-Pre: 94 %
FVC-Pre: 3.34 L
Pre FEV1/FVC ratio: 81 %
Pre FEV6/FVC Ratio: 100 %
RV % PRED: 81 %
RV: 1.32 L
TLC % pred: 99 %
TLC: 4.9 L

## 2019-03-04 ENCOUNTER — Encounter: Payer: Self-pay | Admitting: Cardiology

## 2019-03-04 ENCOUNTER — Ambulatory Visit: Payer: BLUE CROSS/BLUE SHIELD | Admitting: Cardiology

## 2019-03-04 VITALS — BP 110/64 | HR 80 | Ht 65.0 in | Wt 242.0 lb

## 2019-03-04 DIAGNOSIS — I5022 Chronic systolic (congestive) heart failure: Secondary | ICD-10-CM

## 2019-03-04 DIAGNOSIS — I493 Ventricular premature depolarization: Secondary | ICD-10-CM

## 2019-03-04 MED ORDER — SPIRONOLACTONE 25 MG PO TABS: 12.5000 mg | ORAL_TABLET | Freq: Every day | ORAL | 3 refills | Status: DC

## 2019-03-04 NOTE — Progress Notes (Signed)
Clinical Summary Ms. Gurka is a 43 y.o.female seen today for follow up of the following medical problems.   1. Chronic systolic HF - several month history of progressing SOB/DOE, cough, orthopnea. No significant LE edema. Has had some adbomdinal distension - Jan 2020 echo ordered by pcp showed LVEF 15-20%, restricitive diastolic function, mild RV dysfunction.  - 01/2019 cath: no significant CAD. Mean PA 21, PCWP 11, CI 2.85 - cardiac MRI is pending  - no EtOH, no drug use.  - Father with MI in early 76s, no other family history of heart disease. Maternal grandfather early 70s had MI. No significant FH of heart failure per her report.   - not checking weight at home.  - no recent SOB or DOE, no LE edema  2. PSVT - s/p ablation Jan 2017 by Dr Lovena Le    3. 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 SVT (supraventricular tachycardia) (HCC)      Allergies  Allergen Reactions  . Codeine Hives and Itching  . Sulfa Antibiotics Hives     Current Outpatient Medications  Medication Sig Dispense Refill  . acetaminophen (TYLENOL) 500 MG tablet Take 1,000 mg by mouth every 6 (six) hours as needed for mild pain or headache.    . carvedilol (COREG) 6.25 MG tablet Take 1 tablet (6.25 mg total) by mouth 2 (two) times daily. 180 tablet 3  . furosemide (LASIX) 40 MG tablet Take 1 tablet (40 mg total) by mouth daily. 90 tablet 3  . potassium chloride (K-DUR) 10 MEQ tablet Take 1 tablet by mouth daily.    . sacubitril-valsartan (ENTRESTO) 24-26 MG Take 1 tablet by mouth 2 (two) times daily. 60 tablet 11   No current facility-administered medications for this visit.      Past Surgical History:  Procedure Laterality Date  . APPENDECTOMY  1981  . CESAREAN SECTION  1999; 2007  . ELECTROPHYSIOLOGIC STUDY N/A 01/09/2016   Procedure: SVT Ablation;  Surgeon: Evans Lance, MD;  Location: [facility];  Service: Cardiovascular;  Laterality: N/A;  .  LAPAROSCOPIC CHOLECYSTECTOMY  1998  . MICROLARYNGOSCOPY Left 04/26/2018   Procedure: MICROLARYNGOSCOPY WITH EXCISION OF VALLECULER MASS;  Surgeon: Leta Baptist, MD;  Location: [facility];  Service: ENT;  Laterality: Left;  . RIGHT/LEFT HEART CATH AND CORONARY ANGIOGRAPHY N/A 02/09/2019   Procedure: RIGHT/LEFT HEART CATH AND CORONARY ANGIOGRAPHY;  Surgeon: Martinique, Peter M, MD;  Location: [facility];  Service: Cardiovascular;  Laterality: N/A;  . SUPRAVENTRICULAR TACHYCARDIA ABLATION  01/09/2016  . TUBAL LIGATION  2007     Allergies  Allergen Reactions  . Codeine Hives and Itching  . Sulfa Antibiotics Hives      Family History  Problem Relation Age of Onset  . Hypertension Mother   . Heart failure Father   . Hypertension Father   . Stroke Father      Social History Ms. Breth reports that she has never smoked. She has never used smokeless tobacco. Ms. Turner reports no history of alcohol use.   Review of Systems CONSTITUTIONAL: No weight loss, fever, chills, weakness or fatigue.  HEENT: Eyes: No visual loss, blurred vision, double vision or yellow sclerae.No hearing loss, sneezing, congestion, runny nose or sore throat.  SKIN: No rash or itching.  CARDIOVASCULAR: per hpi RESPIRATORY: No shortness of breath, cough or sputum.  GASTROINTESTINAL: No anorexia, nausea, vomiting or diarrhea. No abdominal pain or blood.  GENITOURINARY:  No burning on urination, no polyuria NEUROLOGICAL: No headache, dizziness, syncope, paralysis, ataxia, numbness or tingling in the extremities. No change in bowel or bladder control.  MUSCULOSKELETAL: No muscle, back pain, joint pain or stiffness.  LYMPHATICS: No enlarged nodes. No history of splenectomy.  PSYCHIATRIC: No history of depression or anxiety.  ENDOCRINOLOGIC: No reports of sweating, cold or heat intolerance. No polyuria or polydipsia.  Marland Kitchen   Physical Examination Today's Vitals   03/04/19 1316  BP: 110/64  Pulse: 80   SpO2: 98%  Weight: 242 lb (109.8 kg)  Height: 5\' 5"  (1.651 m)   Body mass index is 40.27 kg/m.  Gen: resting comfortably, no acute distress HEENT: no scleral icterus, pupils equal round and reactive, no palptable cervical adenopathy,  CV: RRR, no m/r/g, no jvd Resp:+cough GI: abdomen is soft, non-tender, non-distended, normal bowel sounds, no hepatosplenomegaly MSK: extremities are warm, no edema.  Skin: warm, no rash Neuro:  no focal deficits Psych: appropriate affect   Diagnostic Studies Jan 2020 echo IMPRESSIONS    1. The left ventricle has severely reduced systolic function, LVEF 86-57%. The cavity size is severely increased. There is normal left ventricular hypertrophy. Echo evidence of restrictive diastolic filling patterns. Elevated left atrial and left  ventricular end-diastolic pressures.  2. Severe diffuse hypokinesis.  3. The right ventricle is in size. There is mildly reduced systolic function. Right ventricular systolic pressure is mildly elevated with an estimated pressure of 28.7 mmHg.  4. Severely dilated left atrial size.  5. The mitral valve normal in structure. Regurgitation is mild by color flow Doppler.  6. Tricuspid regurgitation is mild.  7. The aortic valve tricuspid. Aortic valve regurgitation is mild by color flow Doppler.   01/2019 cath  There is severe left ventricular systolic dysfunction.  LV end diastolic pressure is normal.  The left ventricular ejection fraction is less than 25% by visual estimate.   1. Normal coronary anatomy. Left dominant circulation 2. Severe global LV dysfunction. EF estimated at 15-20%.  3. Normal LV filling pressures 4. Normal right heart pressures 5. Preserved cardiac output. Index 2.85 L/min/m squared.    Assessment and Plan  1. Chronic systolic systolic HF - new diagnosis of HF, echo showed LVEF 15-20% - cath without significant CAD, awaiting cardiac MRI - start aldactone 12.5mg  daily, BMET/Mg in 2  weeks.  - some cough unclear if related to extra volume, take lasix 60mg  x 2 days, then resume 40mg  dosing     2. PVCs - noted on prior EKGs - likely obtain 24 hr holter monitor after MRI to evaluate PVC burden, see if contributing to cardiomyopathy   F/u 1 months      Arnoldo Lenis, M.D

## 2019-03-04 NOTE — Patient Instructions (Signed)
Medication Instructions:  START ALDACTONE 12.5 MG (1/2 TABLET) DAILY   FOR 2 DAYS PLEASE TAKE LASIX 60 MG DAILY & THEN GO BACK TO 40 MG DAILY  Labwork: 2 WEEKS  BMET MAGNESIUM   Testing/Procedures: NONE  Follow-Up: Your physician recommends that you schedule a follow-up appointment in: 1 MONTH    Any Other Special Instructions Will Be Listed Below (If Applicable).     If you need a refill on your cardiac medications before your next appointment, please call your pharmacy.

## 2019-03-07 ENCOUNTER — Encounter: Payer: Self-pay | Admitting: Cardiology

## 2019-03-08 ENCOUNTER — Telehealth (HOSPITAL_COMMUNITY): Payer: Self-pay | Admitting: Emergency Medicine

## 2019-03-08 NOTE — Telephone Encounter (Signed)
Left message on voicemail with name and callback number Jelisa Coleta RN Navigator Cardiac Imaging  [facility] [phone] Office [phone] Cell  

## 2019-03-10 ENCOUNTER — Other Ambulatory Visit: Payer: Self-pay

## 2019-03-10 ENCOUNTER — Ambulatory Visit (HOSPITAL_COMMUNITY)
Admission: RE | Admit: 2019-03-10 | Discharge: 2019-03-10 | Disposition: A | Discharge status: Home / Self Care | Payer: BLUE CROSS/BLUE SHIELD | Source: Ambulatory Visit | Attending: Cardiology | Admitting: Cardiology

## 2019-03-10 DIAGNOSIS — I5022 Chronic systolic (congestive) heart failure: Secondary | ICD-10-CM | POA: Insufficient documentation

## 2019-03-10 MED ORDER — GADOBUTROL 1 MMOL/ML IV SOLN
22.0000 mL | Freq: Once | INTRAVENOUS | Status: AC | PRN
  Administered 2019-03-10: 22 mL via INTRAVENOUS

## 2019-03-14 ENCOUNTER — Telehealth: Payer: Self-pay

## 2019-03-14 NOTE — Telephone Encounter (Signed)
Called pt. No answer, left message for pt to return call.  

## 2019-03-14 NOTE — Telephone Encounter (Signed)
-----   Message from Merlene Laughter, RN sent at 03/11/2019 11:04 AM EDT -----  ----- Message ----- From: Arnoldo Lenis, MD Sent: 03/11/2019   9:47 AM EDT To: Massie Maroon, CMA  Cardiac MRI does not show any evidence of inflammation or specific disease of the heart muscle itself. Please order a 24 hour holter for PVCs, we need to quantify her PVC burden, if very high there is a chance could play a role in her weakened heart muscle  Zandra Abts MD

## 2019-03-15 ENCOUNTER — Telehealth: Payer: Self-pay | Admitting: Cardiology

## 2019-03-15 NOTE — Telephone Encounter (Signed)
Results of cardiac MRI / tg

## 2019-03-16 ENCOUNTER — Telehealth: Payer: Self-pay

## 2019-03-16 DIAGNOSIS — I493 Ventricular premature depolarization: Secondary | ICD-10-CM

## 2019-03-16 NOTE — Telephone Encounter (Signed)
-----   Message from Merlene Laughter, RN sent at 03/11/2019 11:04 AM EDT -----  ----- Message ----- From: Arnoldo Lenis, MD Sent: 03/11/2019   9:47 AM EDT To: Massie Maroon, CMA  Cardiac MRI does not show any evidence of inflammation or specific disease of the heart muscle itself. Please order a 24 hour holter for PVCs, we need to quantify her PVC burden, if very high there is a chance could play a role in her weakened heart muscle  Zandra Abts MD

## 2019-03-16 NOTE — Telephone Encounter (Signed)
Pt made aware

## 2019-03-16 NOTE — Telephone Encounter (Signed)
Called patient with test results. No answer. Left message to call back.  

## 2019-03-16 NOTE — Telephone Encounter (Signed)
Pt made aware of results. Placed order for holter.

## 2019-03-22 DIAGNOSIS — R002 Palpitations: Secondary | ICD-10-CM | POA: Diagnosis not present

## 2019-04-01 ENCOUNTER — Other Ambulatory Visit: Payer: Self-pay

## 2019-04-01 ENCOUNTER — Ambulatory Visit (INDEPENDENT_AMBULATORY_CARE_PROVIDER_SITE_OTHER): Payer: BLUE CROSS/BLUE SHIELD

## 2019-04-01 ENCOUNTER — Other Ambulatory Visit: Payer: Self-pay | Admitting: *Deleted

## 2019-04-01 DIAGNOSIS — R002 Palpitations: Secondary | ICD-10-CM

## 2019-04-06 ENCOUNTER — Other Ambulatory Visit: Payer: Self-pay | Admitting: Cardiology

## 2019-04-06 ENCOUNTER — Telehealth: Payer: Self-pay

## 2019-04-06 DIAGNOSIS — I5022 Chronic systolic (congestive) heart failure: Secondary | ICD-10-CM | POA: Diagnosis not present

## 2019-04-06 NOTE — Telephone Encounter (Signed)
Virtual Visit Pre-Appointment Phone Call  Steps For Call:  1. Confirm consent - "In the setting of the current Covid19 crisis, you are scheduled for a (phone or video) visit with your provider on (date) at (time).  Just as we do with many in-office visits, in order for you to participate in this visit, we must obtain consent.  If you'd like, I can send this to your mychart (if signed up) or email for you to review.  Otherwise, I can obtain your verbal consent now.  All virtual visits are billed to your insurance company just like a normal visit would be.  By agreeing to a virtual visit, we'd like you to understand that the technology does not allow for your provider to perform an examination, and thus may limit your provider's ability to fully assess your condition.  Finally, though the technology is pretty good, we cannot assure that it will always work on either your or our end, and in the setting of a video visit, we may have to convert it to a phone-only visit.  In either situation, we cannot ensure that we have a secure connection.  Are you willing to proceed?"  2. Give patient instructions for WebEx download to smartphone as below if video visit  3. Advise patient to be prepared with any vital sign or heart rhythm information, their current medicines, and a piece of paper and pen handy for any instructions they may receive the day of their visit  4. Inform patient they will receive a phone call 15 minutes prior to their appointment time (may be from unknown caller ID) so they should be prepared to answer  5. Confirm that appointment type is correct in Epic appointment notes (video vs telephone)    TELEPHONE CALL NOTE  Margaret [name] has been deemed a candidate for a follow-up tele-health visit to limit community exposure during the Covid-19 pandemic. I spoke with the patient via phone to ensure availability of phone/video source, confirm preferred email & phone number, and discuss  instructions and expectations.  I reminded Margaret [name] to be prepared with any vital sign and/or heart rhythm information that could potentially be obtained via home monitoring, at the time of her visit. I reminded Margaret [name] to expect a phone call at the time of her visit if her visit.  Did the patient verbally acknowledge consent to treatment? Yes  Drema Dallas, [place] 04/06/2019 12:07 PM   DOWNLOADING THE [place], go to [employer] and type in WebEx in the search bar. [place] [employer], the blue/green circle. The app is free but as with any other app downloads, their phone may require them to verify saved payment information or Apple password. The patient does NOT have to create an account.  - If Android, ask patient to go to [employer] and type in WebEx in the search bar. [place] [employer], the blue/green circle. The app is free but as with any other app downloads, their phone may require them to verify saved payment information or Android password. The patient does NOT have to create an account.   CONSENT FOR TELE-HEALTH VISIT - PLEASE REVIEW  I hereby voluntarily request, consent and authorize [facility] and its employed or contracted physicians, physician assistants, nurse practitioners or other licensed health care professionals (the Practitioner), to provide me with telemedicine health care services (the "Services") as deemed necessary by the treating Practitioner.  I acknowledge and consent to receive the Services by the Practitioner via telemedicine. I understand that the telemedicine visit will involve communicating with the Practitioner through live audiovisual communication technology and the disclosure of certain medical information by electronic transmission. I acknowledge that I have been given the opportunity to request an in-person assessment or other available alternative prior to the telemedicine visit and am  voluntarily participating in the telemedicine visit.  I understand that I have the right to withhold or withdraw my consent to the use of telemedicine in the course of my care at any time, without affecting my right to future care or treatment, and that the Practitioner or I may terminate the telemedicine visit at any time. I understand that I have the right to inspect all information obtained and/or recorded in the course of the telemedicine visit and may receive copies of available information for a reasonable fee.  I understand that some of the potential risks of receiving the Services via telemedicine include:  Marland Kitchen Delay or interruption in medical evaluation due to technological equipment failure or disruption; . Information transmitted may not be sufficient (e.g. poor resolution of images) to allow for appropriate medical decision making by the Practitioner; and/or  . In rare instances, security protocols could fail, causing a breach of personal health information.  Furthermore, I acknowledge that it is my responsibility to provide information about my medical history, conditions and care that is complete and accurate to the best of my ability. I acknowledge that Practitioner's advice, recommendations, and/or decision may be based on factors not within their control, such as incomplete or inaccurate data provided by me or distortions of diagnostic images or specimens that may result from electronic transmissions. I understand that the practice of medicine is not an exact science and that Practitioner makes no warranties or guarantees regarding treatment outcomes. I acknowledge that I will receive a copy of this consent concurrently upon execution via email to the email address I last provided but may also request a printed copy by calling the office of [facility].    I understand that my insurance will be billed for this visit.   I have read or had this consent read to me. . I understand the  contents of this consent, which adequately explains the benefits and risks of the Services being provided via telemedicine.  . I have been provided ample opportunity to ask questions regarding this consent and the Services and have had my questions answered to my satisfaction. . I give my informed consent for the services to be provided through the use of telemedicine in my medical care  By participating in this telemedicine visit I agree to the above.

## 2019-04-07 LAB — BASIC METABOLIC PANEL
BUN/Creatinine Ratio: 24 — ABNORMAL HIGH (ref 9–23)
BUN: 18 mg/dL (ref 6–24)
CO2: 25 mmol/L (ref 20–29)
Calcium: 9.9 mg/dL (ref 8.7–10.2)
Chloride: 100 mmol/L (ref 96–106)
Creatinine, Ser: 0.75 mg/dL (ref 0.57–1.00)
GFR calc Af Amer: 112 mL/min/{1.73_m2} (ref >59)
GFR calc non Af Amer: 97 mL/min/{1.73_m2} (ref >59)
Glucose: 96 mg/dL (ref 65–99)
Potassium: 4.6 mmol/L (ref 3.5–5.2)
Sodium: 139 mmol/L (ref 134–144)

## 2019-04-07 LAB — MAGNESIUM: Magnesium: 2.1 mg/dL (ref 1.6–2.3)

## 2019-04-07 LAB — SPECIMEN STATUS REPORT

## 2019-04-08 ENCOUNTER — Telehealth: Payer: Self-pay | Admitting: *Deleted

## 2019-04-08 NOTE — Telephone Encounter (Signed)
-----   Message from Arnoldo Lenis, MD sent at 04/08/2019  2:15 PM EDT ----- Labs look good  Zandra Abts MD

## 2019-04-08 NOTE — Telephone Encounter (Signed)
Patient informed. Copy sent to PCP °

## 2019-04-11 ENCOUNTER — Telehealth (INDEPENDENT_AMBULATORY_CARE_PROVIDER_SITE_OTHER): Payer: BLUE CROSS/BLUE SHIELD | Admitting: Cardiology

## 2019-04-11 ENCOUNTER — Encounter: Payer: Self-pay | Admitting: Cardiology

## 2019-04-11 VITALS — BP 96/71 | HR 85 | Ht 63.0 in | Wt 247.8 lb

## 2019-04-11 DIAGNOSIS — I493 Ventricular premature depolarization: Secondary | ICD-10-CM | POA: Diagnosis not present

## 2019-04-11 DIAGNOSIS — I5022 Chronic systolic (congestive) heart failure: Secondary | ICD-10-CM | POA: Diagnosis not present

## 2019-04-11 NOTE — Progress Notes (Signed)
Medication Instructions:  Your physician recommends that you continue on your current medications as directed. Please refer to the Current Medication list given to you today.   Labwork: NONE  Testing/Procedures: NONE  Follow-Up: Your physician recommends that you schedule a follow-up appointment in: 2 MONTHS    Any Other Special Instructions Will Be Listed Below (If Applicable).     If you need a refill on your cardiac medications before your next appointment, please call your pharmacy.   

## 2019-04-11 NOTE — Progress Notes (Signed)
Virtual Visit via Telephone Note   This visit type was conducted due to national recommendations for restrictions regarding the COVID-19 Pandemic (e.g. social distancing) in an effort to limit this patient's exposure and mitigate transmission in our community.  Due to her co-morbid illnesses, this patient is at least at moderate risk for complications without adequate follow up.  This format is felt to be most appropriate for this patient at this time.  The patient did not have access to video technology/had technical difficulties with video requiring transitioning to audio format only (telephone).  All issues noted in this document were discussed and addressed.  No physical exam could be performed with this format.  Please refer to the patient's chart for her  consent to telehealth for [facility].   Evaluation Performed:  Follow-up visit  Date:  04/11/2019   ID:  Margaret [name], DOB 08/19/1976, MRN [id number]  Patient Location: Home  Provider Location: Home  PCP:  Celene Squibb, MD  Cardiologist:  Carlyle Dolly, MD  Electrophysiologist:  None   Chief Complaint:  1 month follow up  History of Present Illness:    Margaret [name] is a 43 y.o. female who presents via audio/video conferencing for a telehealth visit today.    seen today for follow up of the following medical problems.   1. Chronic systolic HF -several month history of progressing SOB/DOE, cough, orthopnea. No significant LE edema. Has had some adbomdinal distension   - Jan 2020 echo ordered by pcp showed LVEF 15-20%, restricitive diastolic function, mild RV dysfunction. - 01/2019 cath: no significant CAD. Mean PA 21, PCWP 11, CI 2.85 - cardiac MRI no definitive evidence of myocarditis or infiltrating disease  03/2019 holter: 5.6% PVC burden   - no EtOH, no drug use.  - Father with MI in early 39s, no other family history of heart disease. Maternal grandfather early 56shad MI. No significant FH of heart  failure per her report.     - no recent SOB/DOE. No recent edema. Home weights are stable 247 lbs. Mild fluctuations 244-247 lbs.  - compliant with meds. Checks home bp's regularly, typicalls lows 90-100s/70s. Mild dizziness.        2. PSVT - s/p ablation Jan 2017 by Dr Lovena Le    3. PVCs -noted on prior EKGs 03/2019 holter: 5/6% PVC burden - no recent palpitations.    The patient does not have symptoms concerning for COVID-19 infection (fever, chills, cough, or new shortness of breath).    Past Medical History:  Diagnosis Date  . SVT (supraventricular tachycardia) (HCC)    Past Surgical History:  Procedure Laterality Date  . APPENDECTOMY  1981  . CESAREAN SECTION  1999; 2007  . ELECTROPHYSIOLOGIC STUDY N/A 01/09/2016   Procedure: SVT Ablation;  Surgeon: Evans Lance, MD;  Location: [facility];  Service: Cardiovascular;  Laterality: N/A;  . LAPAROSCOPIC CHOLECYSTECTOMY  1998  . MICROLARYNGOSCOPY Left 04/26/2018   Procedure: MICROLARYNGOSCOPY WITH EXCISION OF VALLECULER MASS;  Surgeon: Leta Baptist, MD;  Location: [facility];  Service: ENT;  Laterality: Left;  . RIGHT/LEFT HEART CATH AND CORONARY ANGIOGRAPHY N/A 02/09/2019   Procedure: RIGHT/LEFT HEART CATH AND CORONARY ANGIOGRAPHY;  Surgeon: Martinique, Peter M, MD;  Location: [facility];  Service: Cardiovascular;  Laterality: N/A;  . SUPRAVENTRICULAR TACHYCARDIA ABLATION  01/09/2016  . TUBAL LIGATION  2007     No outpatient medications have been marked as taking for the 04/11/19 encounter (Appointment) with Harl Bowie,  Alphonse Guild, MD.     Allergies:   Codeine and Sulfa antibiotics   Social History   Tobacco Use  . Smoking status: Never Smoker  . Smokeless tobacco: Never Used  Substance Use Topics  . Alcohol use: No  . Drug use: No     Family Hx: The patient's family history includes Heart failure in her father; Hypertension in her father and mother; Stroke in her father.  ROS:   Please  see the history of present illness.     All other systems reviewed and are negative.   Prior CV studies:   The following studies were reviewed today:  Jan 2020 echo IMPRESSIONS   1. The left ventricle has severely reduced systolic function, LVEF 41-32%. The cavity size is severely increased. There is normal left ventricular hypertrophy. Echo evidence of restrictive diastolic filling patterns. Elevated left atrial and left  ventricular end-diastolic pressures. 2. Severe diffuse hypokinesis. 3. The right ventricle is in size. There is mildly reduced systolic function. Right ventricular systolic pressure is mildly elevated with an estimated pressure of 28.7 mmHg. 4. Severely dilated left atrial size. 5. The mitral valve normal in structure. Regurgitation is mild by color flow Doppler. 6. Tricuspid regurgitation is mild. 7. The aortic valve tricuspid. Aortic valve regurgitation is mild by color flow Doppler.   01/2019 cath  There is severe left ventricular systolic dysfunction.  LV end diastolic pressure is normal.  The left ventricular ejection fraction is less than 25% by visual estimate.  1. Normal coronary anatomy. Left dominant circulation 2. Severe global LV dysfunction. EF estimated at 15-20%.  3. Normal LV filling pressures 4. Normal right heart pressures 5. Preserved cardiac output. Index 2.85 L/min/m squared.   03/2019 holter  Min HR 57, Max HR 145, Avg HR 92  No supraventricular ectopy  Occasional ventricular ectopy (5.6%), all as isolated PVCs and bigeminy/trigeminy  No significant arrhythmias   Labs/Other Tests and Data Reviewed:    EKG:  na  Recent Labs: 07/22/2018: Platelets 204 02/09/2019: Hemoglobin 12.6 04/06/2019: BUN 18; Creatinine, Ser 0.75; Magnesium 2.1; Potassium 4.6; Sodium 139   Recent Lipid Panel No results found for: CHOL, TRIG, HDL, CHOLHDL, LDLCALC, LDLDIRECT  Wt Readings from Last 3 Encounters:  03/04/19 242 lb (109.8 kg)   02/09/19 244 lb (110.7 kg)  02/04/19 244 lb 3.2 oz (110.8 kg)     Objective:    Vital Signs:  There were no vitals taken for this visit.   Normal affect. Normal speech pattern, pleasant. No signs of distress. No auditory sound of heavy breathing or wheezing  ASSESSMENT & PLAN:     1. Chronic systolic systolic HF - new diagnosis of HF, echo showed LVEF 15-20% - no clear etiology by cath, MRI, or holter - continue medical therapy. Soft bp's, no further titration of meds at this time - repeat echo at next f/u pending COVID-19 limitations     2. PVCs -mild burden on recent holter, asymptomatic - continue beta blocker.   COVID-19 Education: The signs and symptoms of COVID-19 were discussed with the patient and how to seek care for testing (follow up with PCP or arrange E-visit).  The importance of social distancing was discussed today.  Time:   Today, I have spent 20 minutes with the patient with telehealth technology discussing the above problems.     Medication Adjustments/Labs and Tests Ordered: Current medicines are reviewed at length with the patient today.  Concerns regarding medicines are outlined above.  Tests Ordered:  No orders of the defined types were placed in this encounter.   Medication Changes: No orders of the defined types were placed in this encounter.   Disposition:  Follow up 2 months  Signed, Carlyle Dolly, MD  04/11/2019 12:38 PM    [place]

## 2019-06-13 ENCOUNTER — Telehealth: Payer: Self-pay | Admitting: Cardiology

## 2019-06-13 NOTE — Telephone Encounter (Signed)
Pt does not weigh self often.Last week her weight was 245 lbs.This weekend she started coughing up white phlegm, decided to  weigh self and was 257.6 lbs  Today she is 254.4 lbs  She takes lasix 40 mg daily

## 2019-06-13 NOTE — Telephone Encounter (Signed)
Patient Has gained 10 pounds and is experiencing a lot of the same symptoms when she had too much fluid

## 2019-06-13 NOTE — Telephone Encounter (Signed)
Take lasix 40mg  bid today, Tues, Wed. Update [place] Thurs on her weights and symptoms   Zandra Abts MD

## 2019-06-13 NOTE — Telephone Encounter (Signed)
LM with instructions on her cell phone per her request

## 2019-06-27 ENCOUNTER — Ambulatory Visit: Payer: BLUE CROSS/BLUE SHIELD | Admitting: Cardiology

## 2019-07-05 ENCOUNTER — Ambulatory Visit: Payer: BLUE CROSS/BLUE SHIELD | Admitting: Cardiology

## 2019-07-05 ENCOUNTER — Encounter: Payer: Self-pay | Admitting: Cardiology

## 2019-07-05 ENCOUNTER — Other Ambulatory Visit: Payer: Self-pay

## 2019-07-05 VITALS — BP 132/78 | HR 89 | Temp 97.5°F | Ht 63.0 in | Wt 257.8 lb

## 2019-07-05 DIAGNOSIS — I5022 Chronic systolic (congestive) heart failure: Secondary | ICD-10-CM

## 2019-07-05 MED ORDER — ENTRESTO 49-51 MG PO TABS: 1.0000 | ORAL_TABLET | Freq: Two times a day (BID) | ORAL | 3 refills | Status: DC

## 2019-07-05 MED ORDER — FUROSEMIDE 40 MG PO TABS: ORAL_TABLET | ORAL | 3 refills | Status: DC

## 2019-07-05 NOTE — Progress Notes (Signed)
Clinical Summary Margaret [name] is a 43 y.o.female seen today for follow up of the following medical problems.   1.Chronicsystolic HF -several month history of progressing SOB/DOE, cough, orthopnea. No significant LE edema. Has had some adbomdinal distension   - Jan 2020 echo ordered by pcp showed LVEF 15-20%, restricitive diastolic function, mild RV dysfunction. - 01/2019 cath: no significant CAD. Mean PA 21, PCWP 11, CI 2.85 - cardiac MRI no definitive evidence of myocarditis or infiltrating disease 03/2019 holter: 5.6% PVC burden   - no EtOH, no drug use.  - Father with MI in early 61s, no other family history of heart disease. Maternal grandfather early 48shad MI. No significant FH of heart failure per her report.     - has had recent 15 lbs weight gain since 02/2019. No significant LE edema. No significant SOB/DOE. Did have some cough that resolved with extra diuertic.     2. PSVT - s/p ablation Jan 2017 by Dr Lovena Le    3. PVCs -noted on prior EKGs 03/2019 holter: 5.6% PVC burden    Past Medical History:  Diagnosis Date  . SVT (supraventricular tachycardia) (HCC)      Allergies  Allergen Reactions  . Codeine Hives and Itching  . Sulfa Antibiotics Hives     Current Outpatient Medications  Medication Sig Dispense Refill  . acetaminophen (TYLENOL) 500 MG tablet Take 1,000 mg by mouth every 6 (six) hours as needed for mild pain or headache.    . carvedilol (COREG) 6.25 MG tablet Take 1 tablet (6.25 mg total) by mouth 2 (two) times daily. 180 tablet 3  . furosemide (LASIX) 40 MG tablet Take 1 tablet (40 mg total) by mouth daily. 90 tablet 3  . potassium chloride (K-DUR) 10 MEQ tablet Take 1 tablet by mouth daily.    . sacubitril-valsartan (ENTRESTO) 24-26 MG Take 1 tablet by mouth 2 (two) times daily. 60 tablet 11  . spironolactone (ALDACTONE) 25 MG tablet Take 0.5 tablets (12.5 mg total) by mouth daily. 45 tablet 3   No current  facility-administered medications for this visit.      Past Surgical History:  Procedure Laterality Date  . APPENDECTOMY  1981  . CESAREAN SECTION  1999; 2007  . ELECTROPHYSIOLOGIC STUDY N/A 01/09/2016   Procedure: SVT Ablation;  Surgeon: Evans Lance, MD;  Location: [facility];  Service: Cardiovascular;  Laterality: N/A;  . LAPAROSCOPIC CHOLECYSTECTOMY  1998  . MICROLARYNGOSCOPY Left 04/26/2018   Procedure: MICROLARYNGOSCOPY WITH EXCISION OF VALLECULER MASS;  Surgeon: Leta Baptist, MD;  Location: [facility];  Service: ENT;  Laterality: Left;  . RIGHT/LEFT HEART CATH AND CORONARY ANGIOGRAPHY N/A 02/09/2019   Procedure: RIGHT/LEFT HEART CATH AND CORONARY ANGIOGRAPHY;  Surgeon: Martinique, Peter M, MD;  Location: [facility];  Service: Cardiovascular;  Laterality: N/A;  . SUPRAVENTRICULAR TACHYCARDIA ABLATION  01/09/2016  . TUBAL LIGATION  2007     Allergies  Allergen Reactions  . Codeine Hives and Itching  . Sulfa Antibiotics Hives      Family History  Problem Relation Age of Onset  . Hypertension Mother   . Heart failure Father   . Hypertension Father   . Stroke Father      Social History Ms. Strieter reports that she has never smoked. She has never used smokeless tobacco. Ms. Schwinn reports no history of alcohol use.   Review of Systems CONSTITUTIONAL: No weight loss, fever, chills, weakness or fatigue.  HEENT: Eyes: No visual  loss, blurred vision, double vision or yellow sclerae.No hearing loss, sneezing, congestion, runny nose or sore throat.  SKIN: No rash or itching.  CARDIOVASCULAR: per hpi RESPIRATORY: No shortness of breath, cough or sputum.  GASTROINTESTINAL: No anorexia, nausea, vomiting or diarrhea. No abdominal pain or blood.  GENITOURINARY: No burning on urination, no polyuria NEUROLOGICAL: No headache, dizziness, syncope, paralysis, ataxia, numbness or tingling in the extremities. No change in bowel or bladder control.   MUSCULOSKELETAL: No muscle, back pain, joint pain or stiffness.  LYMPHATICS: No enlarged nodes. No history of splenectomy.  PSYCHIATRIC: No history of depression or anxiety.  ENDOCRINOLOGIC: No reports of sweating, cold or heat intolerance. No polyuria or polydipsia.  Marland Kitchen   Physical Examination Today's Vitals   07/05/19 1401  BP: 132/78  Pulse: 89  Temp: (!) 97.5 F (36.4 C)  SpO2: 99%  Weight: 257 lb 12.8 oz (116.9 kg)  Height: 5\' 3"  (1.6 m)   Body mass index is 45.67 kg/m.  Gen: resting comfortably, no acute distress HEENT: no scleral icterus, pupils equal round and reactive, no palptable cervical adenopathy,  CV: RRR, no m/r,g, no jvd Resp: Clear to auscultation bilaterally GI: abdomen is soft, non-tender, non-distended, normal bowel sounds, no hepatosplenomegaly MSK: extremities are warm, no edema.  Skin: warm, no rash Neuro:  no focal deficits Psych: appropriate affect   Diagnostic Studies  Jan 2020 echo IMPRESSIONS   1. The left ventricle has severely reduced systolic function, LVEF 71-24%. The cavity size is severely increased. There is normal left ventricular hypertrophy. Echo evidence of restrictive diastolic filling patterns. Elevated left atrial and left  ventricular end-diastolic pressures. 2. Severe diffuse hypokinesis. 3. The right ventricle is in size. There is mildly reduced systolic function. Right ventricular systolic pressure is mildly elevated with an estimated pressure of 28.7 mmHg. 4. Severely dilated left atrial size. 5. The mitral valve normal in structure. Regurgitation is mild by color flow Doppler. 6. Tricuspid regurgitation is mild. 7. The aortic valve tricuspid. Aortic valve regurgitation is mild by color flow Doppler.   01/2019 cath  There is severe left ventricular systolic dysfunction.  LV end diastolic pressure is normal.  The left ventricular ejection fraction is less than 25% by visual estimate.  1. Normal coronary  anatomy. Left dominant circulation 2. Severe global LV dysfunction. EF estimated at 15-20%.  3. Normal LV filling pressures 4. Normal right heart pressures 5. Preserved cardiac output. Index 2.85 L/min/m squared.   03/2019 holter  Min HR 57, Max HR 145, Avg HR 92  No supraventricular ectopy  Occasional ventricular ectopy (5.6%), all as isolated PVCs and bigeminy/trigeminy  No significant arrhythmias   Assessment and Plan  1.Chronic systolicsystolic HF - no clear etiology by cath, MRI, or holter - medical therapy intiially limited by soft bp's that have trended up, we will increase entresto to 49/51mg  bid. Check BMET/Mg/BNP in 2 weeks - 15 lbs weight gain since 02/2019, I don't see significant evidence on exam of fluid overload and no significant symptoms, I think this is dietary related weight gain, we discussed cutting calories. Will f/u BNP. Instructed can take lasix 40mg  daily, additional 40mg  prn - repeat echo, may need ICD consideration and possibly CHF clinic referal. Given young age and limited comorbidities would look to have her establish early with CHF in case advanced therapies become needed in the future.     F/u pending echo results     Arnoldo Lenis, M.D.

## 2019-07-05 NOTE — Patient Instructions (Signed)
Medication Instructions:  INCREASE ENTRESTO TO 49/51 MG- TWO TIMES DAILY   LASIX- TAKE 40 MG DAILY, MAY TAKE AN EXTRA 40 MG DAILY AS NEEDED FOR SWELLING OR SHORTNESS OF BREATH   Labwork: 2 WEEKS  BMET MAG BNP  Testing/Procedures: Your physician has requested that you have an echocardiogram. Echocardiography is a painless test that uses sound waves to create images of your heart. It provides your doctor with information about the size and shape of your heart and how well your heart's chambers and valves are working. This procedure takes approximately one hour. There are no restrictions for this procedure.    Follow-Up: Your physician recommends that you schedule a follow-up appointment GB:[id number] -  BASED ON ECHO RESULTS    Any Other Special Instructions Will Be Listed Below (If Applicable).     If you need a refill on your cardiac medications before your next appointment, please call your pharmacy.

## 2019-07-13 ENCOUNTER — Ambulatory Visit (HOSPITAL_COMMUNITY)
Admission: RE | Admit: 2019-07-13 | Discharge: 2019-07-13 | Disposition: A | Discharge status: Home / Self Care | Payer: BC Managed Care – PPO | Source: Ambulatory Visit | Attending: Cardiology | Admitting: Cardiology

## 2019-07-13 ENCOUNTER — Other Ambulatory Visit: Payer: Self-pay

## 2019-07-13 DIAGNOSIS — I5022 Chronic systolic (congestive) heart failure: Secondary | ICD-10-CM

## 2019-07-13 NOTE — Progress Notes (Signed)
*  PRELIMINARY RESULTS* Echocardiogram 2D Echocardiogram has been performed.  Samuel Germany 07/13/2019, 3:41 PM

## 2019-07-18 ENCOUNTER — Telehealth: Payer: Self-pay | Admitting: Cardiology

## 2019-07-18 ENCOUNTER — Other Ambulatory Visit (HOSPITAL_COMMUNITY)
Admission: RE | Admit: 2019-07-18 | Discharge: 2019-07-18 | Disposition: A | Discharge status: Home / Self Care | Payer: BC Managed Care – PPO | Source: Ambulatory Visit | Attending: Cardiology | Admitting: Cardiology

## 2019-07-18 DIAGNOSIS — I5022 Chronic systolic (congestive) heart failure: Secondary | ICD-10-CM | POA: Insufficient documentation

## 2019-07-18 LAB — BASIC METABOLIC PANEL
Anion gap: 7 (ref 5–15)
BUN: 13 mg/dL (ref 6–20)
CO2: 25 mmol/L (ref 22–32)
Calcium: 9 mg/dL (ref 8.9–10.3)
Chloride: 109 mmol/L (ref 98–111)
Creatinine, Ser: 0.65 mg/dL (ref 0.44–1.00)
GFR calc Af Amer: >60 mL/min (ref >60)
GFR calc non Af Amer: >60 mL/min (ref >60)
Glucose, Bld: 114 mg/dL — ABNORMAL HIGH (ref 70–99)
Potassium: 3.4 mmol/L — ABNORMAL LOW (ref 3.5–5.1)
Sodium: 141 mmol/L (ref 135–145)

## 2019-07-18 LAB — MAGNESIUM: Magnesium: 2 mg/dL (ref 1.7–2.4)

## 2019-07-18 LAB — BRAIN NATRIURETIC PEPTIDE: B Natriuretic Peptide: 90 pg/mL (ref 0.0–100.0)

## 2019-07-18 NOTE — Telephone Encounter (Signed)
Pt notified of results

## 2019-07-18 NOTE — Telephone Encounter (Signed)
Just sent result note to your inbox   Zandra Abts MD

## 2019-07-18 NOTE — Telephone Encounter (Signed)
Would like to know results from Echo

## 2019-07-21 ENCOUNTER — Telehealth: Payer: Self-pay

## 2019-07-21 MED ORDER — POTASSIUM CHLORIDE CRYS ER 20 MEQ PO TBCR: 20.0000 meq | EXTENDED_RELEASE_TABLET | Freq: Every day | ORAL | 3 refills | Status: DC

## 2019-07-21 NOTE — Telephone Encounter (Signed)
Pt made aware. She is taking potassium 10 meq daily, but will increase to 20 meq daily. Sent in new RX.

## 2019-07-21 NOTE — Telephone Encounter (Signed)
-----   Message from Arnoldo Lenis, MD sent at 07/21/2019  1:15 PM EDT ----- Labs show potassium is a little low. Verify she is taking KCl 59mEq at home, if so increase to 53mEq daily  Zandra Abts MD

## 2019-07-27 DIAGNOSIS — I5033 Acute on chronic diastolic (congestive) heart failure: Secondary | ICD-10-CM | POA: Diagnosis not present

## 2019-07-27 DIAGNOSIS — R945 Abnormal results of liver function studies: Secondary | ICD-10-CM | POA: Diagnosis not present

## 2019-07-27 DIAGNOSIS — E041 Nontoxic single thyroid nodule: Secondary | ICD-10-CM | POA: Diagnosis not present

## 2019-08-03 DIAGNOSIS — I5033 Acute on chronic diastolic (congestive) heart failure: Secondary | ICD-10-CM | POA: Diagnosis not present

## 2019-08-03 DIAGNOSIS — R000 Tachycardia, unspecified: Secondary | ICD-10-CM | POA: Diagnosis not present

## 2019-08-03 DIAGNOSIS — E041 Nontoxic single thyroid nodule: Secondary | ICD-10-CM | POA: Diagnosis not present

## 2019-08-03 DIAGNOSIS — R945 Abnormal results of liver function studies: Secondary | ICD-10-CM | POA: Diagnosis not present

## 2019-09-15 DIAGNOSIS — Z6841 Body Mass Index (BMI) 40.0 and over, adult: Secondary | ICD-10-CM | POA: Diagnosis not present

## 2019-09-15 DIAGNOSIS — E669 Obesity, unspecified: Secondary | ICD-10-CM | POA: Diagnosis not present

## 2019-09-15 DIAGNOSIS — Z713 Dietary counseling and surveillance: Secondary | ICD-10-CM | POA: Diagnosis not present

## 2019-09-30 ENCOUNTER — Ambulatory Visit: Payer: BC Managed Care – PPO | Admitting: Cardiology

## 2019-11-08 ENCOUNTER — Ambulatory Visit: Payer: BC Managed Care – PPO | Admitting: Cardiology

## 2019-11-08 ENCOUNTER — Encounter (INDEPENDENT_AMBULATORY_CARE_PROVIDER_SITE_OTHER): Payer: Self-pay

## 2019-11-08 ENCOUNTER — Other Ambulatory Visit: Payer: Self-pay

## 2019-11-08 ENCOUNTER — Encounter: Payer: Self-pay | Admitting: Cardiology

## 2019-11-08 VITALS — BP 112/67 | HR 66 | Temp 97.1°F | Ht 63.0 in | Wt 259.0 lb

## 2019-11-08 DIAGNOSIS — I471 Supraventricular tachycardia: Secondary | ICD-10-CM

## 2019-11-08 DIAGNOSIS — I493 Ventricular premature depolarization: Secondary | ICD-10-CM

## 2019-11-08 DIAGNOSIS — I5022 Chronic systolic (congestive) heart failure: Secondary | ICD-10-CM

## 2019-11-08 MED ORDER — CARVEDILOL 6.25 MG PO TABS: ORAL_TABLET | ORAL | 3 refills | Status: DC

## 2019-11-08 NOTE — Progress Notes (Signed)
Clinical Summary Margaret [name] is a 43 y.o.female seen today for follow up of the following medical problems.   1.Chronicsystolic HF -several month history of progressing SOB/DOE, cough, orthopnea. No significant LE edema. Has had some adbomdinal distension   - Jan 2020 echo ordered by pcp showed LVEF 15-20%, restricitive diastolic function, mild RV dysfunction. - 01/2019 cath: no significant CAD. Mean PA 21, PCWP 11, CI 2.85 - cardiac MRIno definitive evidence of myocarditis or infiltrating disease 03/2019 holter: 5.6% PVC burden   - no EtOH, no drug use.  - Father with MI in early 95s, no other family history of heart disease. Maternal grandfather early 79shad MI. No significant FH of heart failure per her report.     06/2019 echo LVEF 35-40% - no recent SOB or DOE. No recent edema - compliant with meds. Rare infrequent dizziness.    2. PSVT - s/p ablation Jan 2017 by Dr Lovena Le  - no recent palpitaiotns.    3. PVCs -noted on prior EKGs 03/2019 holter: 5.6% PVC burden  - no palpitations   Past Medical History:  Diagnosis Date  . SVT (supraventricular tachycardia) (HCC)      Allergies  Allergen Reactions  . Codeine Hives and Itching  . Sulfa Antibiotics Hives     Current Outpatient Medications  Medication Sig Dispense Refill  . acetaminophen (TYLENOL) 500 MG tablet Take 1,000 mg by mouth every 6 (six) hours as needed for mild pain or headache.    . carvedilol (COREG) 6.25 MG tablet Take 1 tablet (6.25 mg total) by mouth 2 (two) times daily. 180 tablet 3  . furosemide (LASIX) 40 MG tablet TAKE 40 MG DAILY. MAY TAKE AN ADDITIONAL 40 MG DAILY AS NEEDED FOR SHORTNESS OF BREATH OR SWELLING. 180 tablet 3  . potassium chloride (K-DUR) 10 MEQ tablet Take 1 tablet by mouth daily.    . potassium chloride SA (K-DUR) 20 MEQ tablet Take 1 tablet (20 mEq total) by mouth daily. 90 tablet 3  . sacubitril-valsartan (ENTRESTO) 49-51 MG Take 1 tablet by  mouth 2 (two) times daily. 60 tablet 3  . spironolactone (ALDACTONE) 25 MG tablet Take 0.5 tablets (12.5 mg total) by mouth daily. 45 tablet 3   No current facility-administered medications for this visit.      Past Surgical History:  Procedure Laterality Date  . APPENDECTOMY  1981  . CESAREAN SECTION  1999; 2007  . ELECTROPHYSIOLOGIC STUDY N/A 01/09/2016   Procedure: SVT Ablation;  Surgeon: Evans Lance, MD;  Location: [facility];  Service: Cardiovascular;  Laterality: N/A;  . LAPAROSCOPIC CHOLECYSTECTOMY  1998  . MICROLARYNGOSCOPY Left 04/26/2018   Procedure: MICROLARYNGOSCOPY WITH EXCISION OF VALLECULER MASS;  Surgeon: Leta Baptist, MD;  Location: [facility];  Service: ENT;  Laterality: Left;  . RIGHT/LEFT HEART CATH AND CORONARY ANGIOGRAPHY N/A 02/09/2019   Procedure: RIGHT/LEFT HEART CATH AND CORONARY ANGIOGRAPHY;  Surgeon: Martinique, Peter M, MD;  Location: [facility];  Service: Cardiovascular;  Laterality: N/A;  . SUPRAVENTRICULAR TACHYCARDIA ABLATION  01/09/2016  . TUBAL LIGATION  2007     Allergies  Allergen Reactions  . Codeine Hives and Itching  . Sulfa Antibiotics Hives      Family History  Problem Relation Age of Onset  . Hypertension Mother   . Heart failure Father   . Hypertension Father   . Stroke Father      Social History Ms. Kopf reports that she has never smoked. She has  never used smokeless tobacco. Ms. Rimmer reports no history of alcohol use.   Review of Systems CONSTITUTIONAL: No weight loss, fever, chills, weakness or fatigue.  HEENT: Eyes: No visual loss, blurred vision, double vision or yellow sclerae.No hearing loss, sneezing, congestion, runny nose or sore throat.  SKIN: No rash or itching.  CARDIOVASCULAR: per hpi RESPIRATORY: No shortness of breath, cough or sputum.  GASTROINTESTINAL: No anorexia, nausea, vomiting or diarrhea. No abdominal pain or blood.  GENITOURINARY: No burning on urination, no polyuria  NEUROLOGICAL: No headache, dizziness, syncope, paralysis, ataxia, numbness or tingling in the extremities. No change in bowel or bladder control.  MUSCULOSKELETAL: No muscle, back pain, joint pain or stiffness.  LYMPHATICS: No enlarged nodes. No history of splenectomy.  PSYCHIATRIC: No history of depression or anxiety.  ENDOCRINOLOGIC: No reports of sweating, cold or heat intolerance. No polyuria or polydipsia.  Marland Kitchen   Physical Examination Today's Vitals   11/08/19 0817  BP: 112/67  Pulse: 66  Temp: (!) 97.1 F (36.2 C)  Weight: 259 lb (117.5 kg)  Height: 5\' 3"  (1.6 m)   Body mass index is 45.88 kg/m.  Gen: resting comfortably, no acute distress HEENT: no scleral icterus, pupils equal round and reactive, no palptable cervical adenopathy,  CV: RRR, no m/r/g, no jvd Resp: Clear to auscultation bilaterally GI: abdomen is soft, non-tender, non-distended, normal bowel sounds, no hepatosplenomegaly MSK: extremities are warm, no edema.  Skin: warm, no rash Neuro:  no focal deficits Psych: appropriate affect   Diagnostic Studies  Jan 2020 echo IMPRESSIONS   1. The left ventricle has severely reduced systolic function, LVEF 0000000. The cavity size is severely increased. There is normal left ventricular hypertrophy. Echo evidence of restrictive diastolic filling patterns. Elevated left atrial and left  ventricular end-diastolic pressures. 2. Severe diffuse hypokinesis. 3. The right ventricle is in size. There is mildly reduced systolic function. Right ventricular systolic pressure is mildly elevated with an estimated pressure of 28.7 mmHg. 4. Severely dilated left atrial size. 5. The mitral valve normal in structure. Regurgitation is mild by color flow Doppler. 6. Tricuspid regurgitation is mild. 7. The aortic valve tricuspid. Aortic valve regurgitation is mild by color flow Doppler.   01/2019 cath  There is severe left ventricular systolic dysfunction.  LV end  diastolic pressure is normal.  The left ventricular ejection fraction is less than 25% by visual estimate.  1. Normal coronary anatomy. Left dominant circulation 2. Severe global LV dysfunction. EF estimated at 15-20%.  3. Normal LV filling pressures 4. Normal right heart pressures 5. Preserved cardiac output. Index 2.85 L/min/m squared.   03/2019 holter  Min HR 57, Max HR 145, Avg HR 92  No supraventricular ectopy  Occasional ventricular ectopy (5.6%), all as isolated PVCs and bigeminy/trigeminy  No significant arrhythmias    06/2019 echo IMPRESSIONS    1. The left ventricle has moderately reduced systolic function, with an ejection fraction of 35-40%. The cavity size was moderate to severely dilated. Left ventricular diastolic Doppler parameters are consistent with impaired relaxation. Left  ventricular diffuse hypokinesis.  2. The mitral valve is grossly normal. Mild thickening of the mitral valve leaflet.  3. The tricuspid valve is grossly normal.  4. The aortic valve is tricuspid. Aortic valve regurgitation is mild by color flow Doppler.  5. The aortic root is normal in size and structure.  Assessment and Plan   1.Chronic systolicsystolic HF -no clear etiology by cath, MRI, or holter - medical therapy intiially limited by soft bp's -  has had some significant improvement in function but not back to normal  - change coreg, keep AM dose at 6.25mg  and increase evening dose to 12.5mg  - f/u 2 months, likely repeat echo at that time.    2. PSVT - no symptoms, continue beta blocker  3. PVCs - no symptoms, continue beta blocker    F/u 2 months     Arnoldo Lenis, M.D.

## 2019-11-08 NOTE — Patient Instructions (Signed)
Medication Instructions:  INCREASE COREG-  TAKE 6.25 MG IN THE MORNING (1 TABLET)  TAKE 12.5 MG IN THE EVENING  (2 TABLETS)   Labwork: I WILL REQUEST LABS FROM PCP  Testing/Procedures: NONE  Follow-Up: Your physician recommends that you schedule a follow-up appointment in: 2 MONTHS    Any Other Special Instructions Will Be Listed Below (If Applicable).     If you need a refill on your cardiac medications before your next appointment, please call your pharmacy.

## 2019-11-29 DIAGNOSIS — E669 Obesity, unspecified: Secondary | ICD-10-CM | POA: Diagnosis not present

## 2019-11-29 DIAGNOSIS — Z713 Dietary counseling and surveillance: Secondary | ICD-10-CM | POA: Diagnosis not present

## 2019-11-29 DIAGNOSIS — Z6841 Body Mass Index (BMI) 40.0 and over, adult: Secondary | ICD-10-CM | POA: Diagnosis not present

## 2019-12-01 ENCOUNTER — Other Ambulatory Visit: Payer: Self-pay | Admitting: Cardiology

## 2020-01-02 DIAGNOSIS — Z20828 Contact with and (suspected) exposure to other viral communicable diseases: Secondary | ICD-10-CM | POA: Diagnosis not present

## 2020-01-02 DIAGNOSIS — J069 Acute upper respiratory infection, unspecified: Secondary | ICD-10-CM | POA: Diagnosis not present

## 2020-01-16 ENCOUNTER — Ambulatory Visit: Payer: BC Managed Care – PPO | Admitting: Cardiology

## 2020-01-30 ENCOUNTER — Ambulatory Visit: Payer: BC Managed Care – PPO | Admitting: Cardiology

## 2020-02-03 DIAGNOSIS — R7301 Impaired fasting glucose: Secondary | ICD-10-CM | POA: Diagnosis not present

## 2020-02-03 DIAGNOSIS — E041 Nontoxic single thyroid nodule: Secondary | ICD-10-CM | POA: Diagnosis not present

## 2020-02-03 DIAGNOSIS — E785 Hyperlipidemia, unspecified: Secondary | ICD-10-CM | POA: Diagnosis not present

## 2020-02-03 DIAGNOSIS — R945 Abnormal results of liver function studies: Secondary | ICD-10-CM | POA: Diagnosis not present

## 2020-02-05 IMAGING — DX DG THORACIC SPINE 3V
3 series · 3 of 3 positions shown · non-contrast
Comparison: Prior radiograph from 04/27/2015.

CLINICAL DATA: Initial evaluation for mid upper back pain with
right shoulder blade pain for several months.

EXAM:
THORACIC SPINE - 3 VIEWS

[t-spine ap]
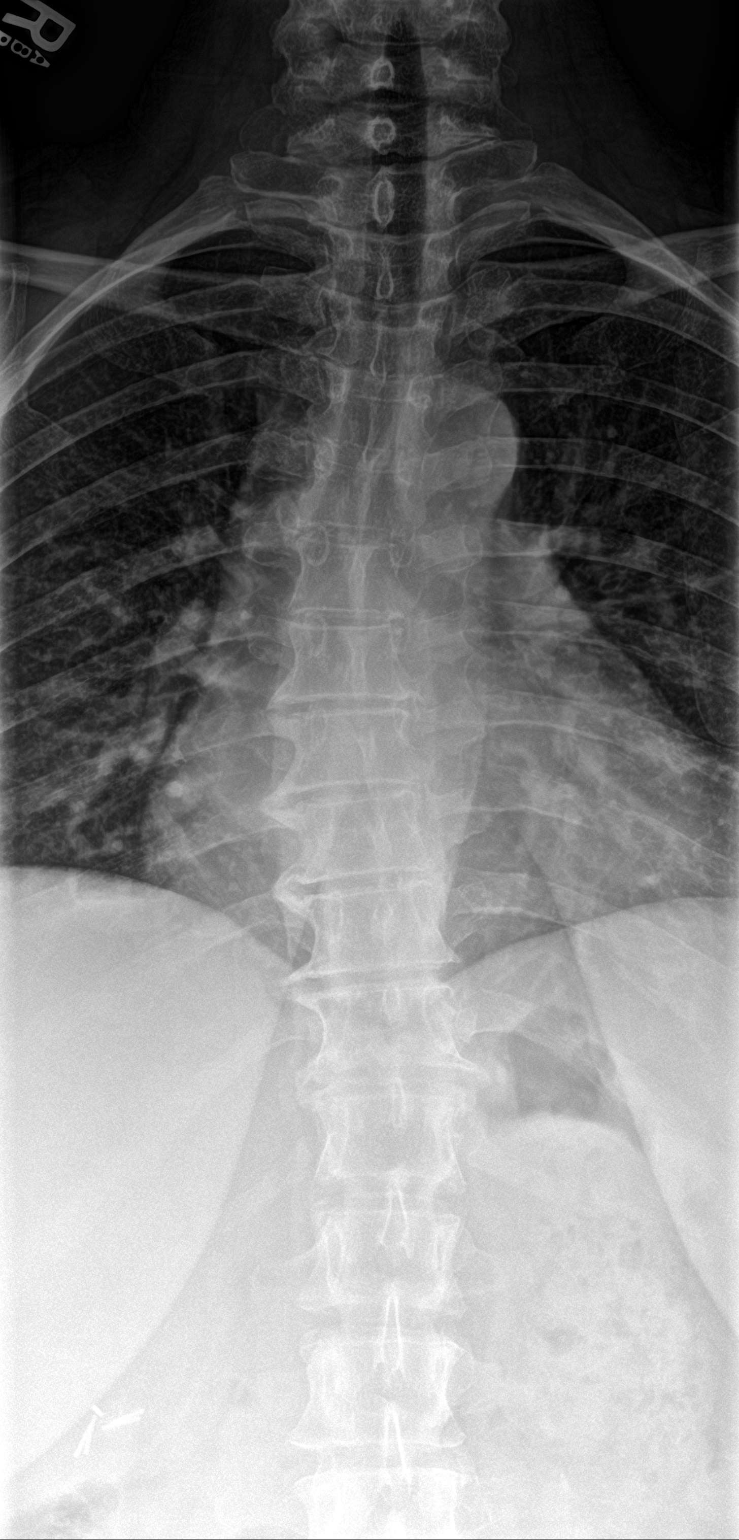

[t-spine lat]
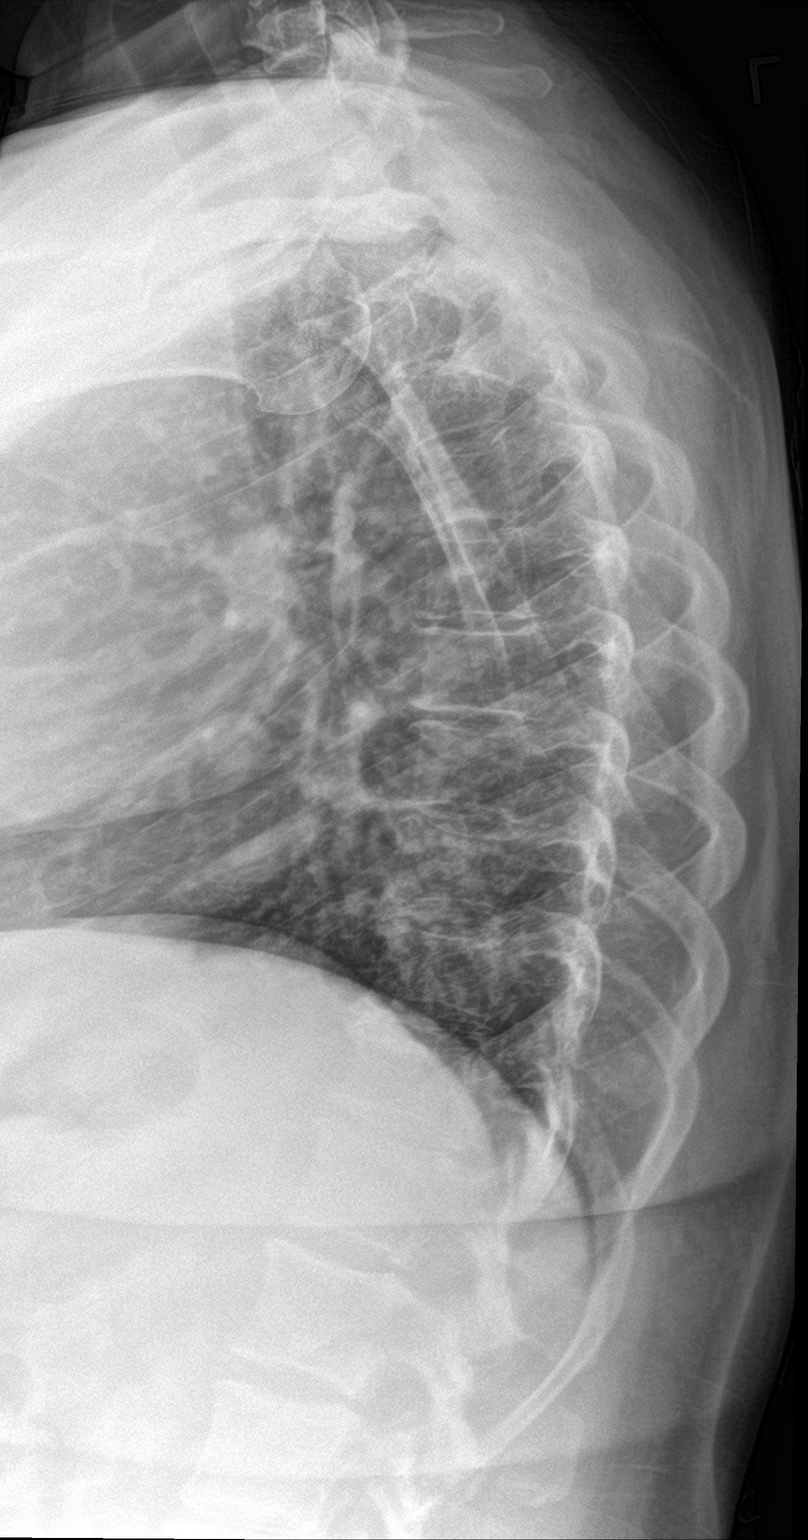

[t-spine swimmers]
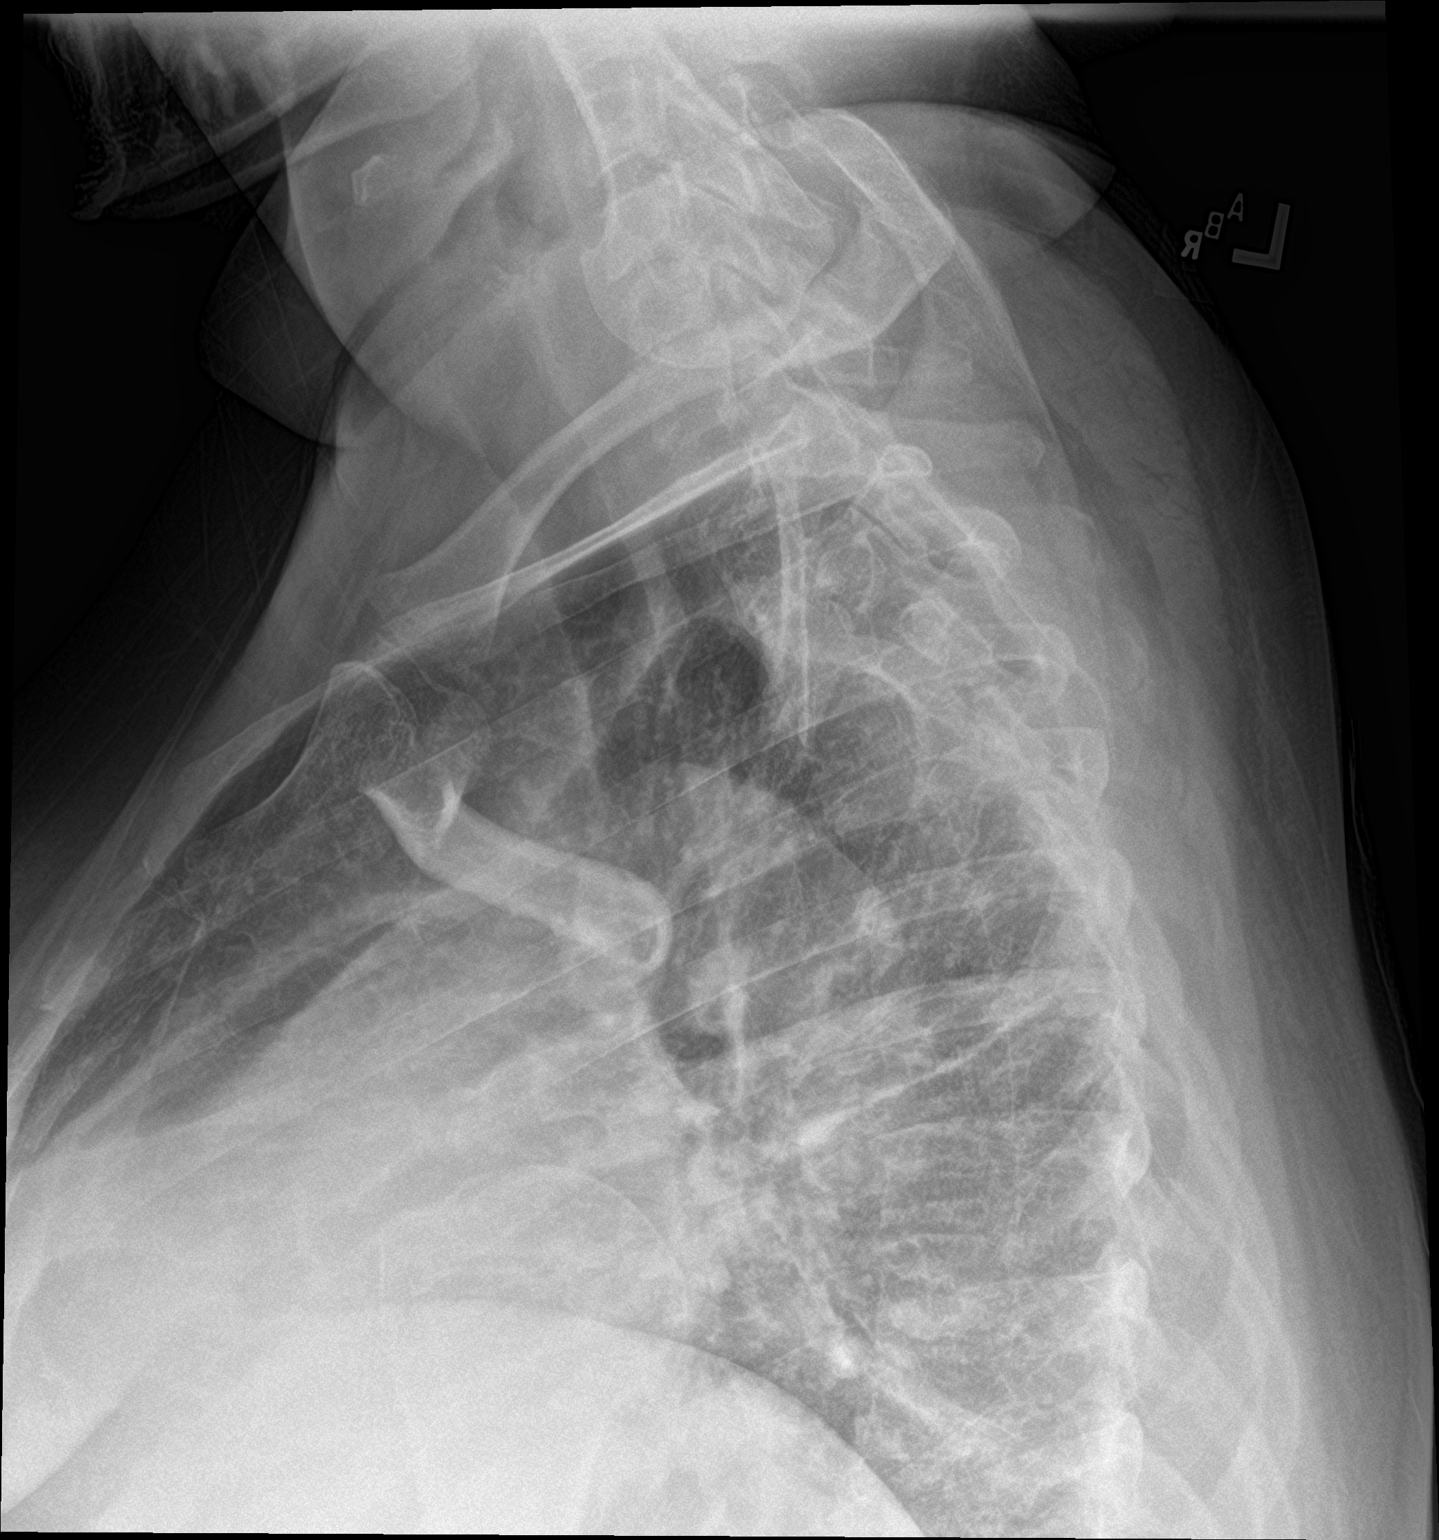

[3 of 3 positions shown; findings below may reference images not displayed]

FINDINGS: Dextroscoliosis. Vertebral bodies otherwise normally aligned with
preservation of the normal thoracic kyphosis. Vertebral body heights
maintained without evidence for acute or chronic fracture. No
discrete osseous lesions. Multilevel degenerative endplate spurring
seen throughout the mid and lower thoracic spine.

Visualized soft tissues within normal limits. Cholecystectomy clips
noted.
IMPRESSION: 1. Dextroscoliosis with multilevel degenerative spurring within the
mid and lower thoracic spine.
2. No acute abnormality identified.

## 2020-02-14 DIAGNOSIS — Z0001 Encounter for general adult medical examination with abnormal findings: Secondary | ICD-10-CM | POA: Diagnosis not present

## 2020-02-23 ENCOUNTER — Ambulatory Visit (INDEPENDENT_AMBULATORY_CARE_PROVIDER_SITE_OTHER): Payer: Self-pay | Admitting: Cardiology

## 2020-02-23 ENCOUNTER — Encounter: Payer: Self-pay | Admitting: Cardiology

## 2020-02-23 ENCOUNTER — Other Ambulatory Visit: Payer: Self-pay | Admitting: Cardiology

## 2020-02-23 VITALS — BP 112/70 | HR 83 | Temp 98.0°F | Ht 63.0 in | Wt 256.0 lb

## 2020-02-23 DIAGNOSIS — I493 Ventricular premature depolarization: Secondary | ICD-10-CM

## 2020-02-23 DIAGNOSIS — I471 Supraventricular tachycardia: Secondary | ICD-10-CM

## 2020-02-23 DIAGNOSIS — I5022 Chronic systolic (congestive) heart failure: Secondary | ICD-10-CM

## 2020-02-23 NOTE — Patient Instructions (Signed)
Medication Instructions:  Your physician recommends that you continue on your current medications as directed. Please refer to the Current Medication list given to you today.   Labwork: NONE  Testing/Procedures: Your physician has requested that you have an echocardiogram. Echocardiography is a painless test that uses sound waves to create images of your heart. It provides your doctor with information about the size and shape of your heart and how well your heart's chambers and valves are working. This procedure takes approximately one hour. There are no restrictions for this procedure.    Follow-Up: Your physician recommends that you schedule a follow-up appointment in: PENDING TEST RESULTS    Any Other Special Instructions Will Be Listed Below (If Applicable).     If you need a refill on your cardiac medications before your next appointment, please call your pharmacy.

## 2020-02-23 NOTE — Progress Notes (Signed)
Clinical Summary Margaret [name] is a 44 y.o.female seen today forfollow up of the following medical problems.  1.Chronicsystolic HF -several month history of progressing SOB/DOE, cough, orthopnea. No significant LE edema. Has had some adbomdinal distension   - Jan 2020 echo ordered by pcp showed LVEF 15-20%, restricitive diastolic function, mild RV dysfunction. - 01/2019 cath: no significant CAD. Mean PA 21, PCWP 11, CI 2.85 - cardiac MRIno definitive evidence of myocarditis or infiltrating disease 03/2019 holter: 5.6% PVC burden   - no EtOH, no drug use.  - Father with MI in early 55s, no other family history of heart disease. Maternal grandfather early 32shad MI. No significant FH of heart failure per her report.     06/2019 echo LVEF 35-40%  - last visit we increased coreg to 6.25mg  in AM and 12.5mg  in PM - some fatigue first thing in AM. - no lightheadnedess or dizziness - no recent edema, no SOB/DOE.   2. PSVT - s/p ablation Jan 2017 by Dr Lovena Le  No recent palpitations.    3. PVCs -noted on prior EKGs 03/2019 holter: 5.6% PVC burden  - no recent palpitations   4. COVID infection - she was infected in early January - mild course - mother was hospitalized at [facility] with covid, was on ventilator but was able to recover     SH: works as Quarry manager at [employer] Past Medical History:  Diagnosis Date  . SVT (supraventricular tachycardia) (HCC)      Allergies  Allergen Reactions  . Codeine Hives and Itching  . Sulfa Antibiotics Hives     Current Outpatient Medications  Medication Sig Dispense Refill  . acetaminophen (TYLENOL) 500 MG tablet Take 1,000 mg by mouth every 6 (six) hours as needed for mild pain or headache.    . carvedilol (COREG) 6.25 MG tablet Take 6.25 mg every morning (1 tablet), take 12.5 mg every evening (2 tablets). 270 tablet 3  . ENTRESTO 49-51 MG TAKE ONE TABLET BY MOUTH TWICE A DAY 60 tablet 3  . furosemide  (LASIX) 40 MG tablet TAKE 40 MG DAILY. MAY TAKE AN ADDITIONAL 40 MG DAILY AS NEEDED FOR SHORTNESS OF BREATH OR SWELLING. 180 tablet 3  . potassium chloride (K-DUR) 10 MEQ tablet Take 1 tablet by mouth daily.    . potassium chloride SA (K-DUR) 20 MEQ tablet Take 1 tablet (20 mEq total) by mouth daily. 90 tablet 3  . spironolactone (ALDACTONE) 25 MG tablet TAKE ONE-HALF TABLET (12.5MG  TOTAL) BY MOUTH DAILY 45 tablet 3   No current facility-administered medications for this visit.     Past Surgical History:  Procedure Laterality Date  . APPENDECTOMY  1981  . CESAREAN SECTION  1999; 2007  . ELECTROPHYSIOLOGIC STUDY N/A 01/09/2016   Procedure: SVT Ablation;  Surgeon: Evans Lance, MD;  Location: [facility];  Service: Cardiovascular;  Laterality: N/A;  . LAPAROSCOPIC CHOLECYSTECTOMY  1998  . MICROLARYNGOSCOPY Left 04/26/2018   Procedure: MICROLARYNGOSCOPY WITH EXCISION OF VALLECULER MASS;  Surgeon: Leta Baptist, MD;  Location: [facility];  Service: ENT;  Laterality: Left;  . RIGHT/LEFT HEART CATH AND CORONARY ANGIOGRAPHY N/A 02/09/2019   Procedure: RIGHT/LEFT HEART CATH AND CORONARY ANGIOGRAPHY;  Surgeon: Martinique, Peter M, MD;  Location: [place] [facility];  Service: Cardiovascular;  Laterality: N/A;  . SUPRAVENTRICULAR TACHYCARDIA ABLATION  01/09/2016  . TUBAL LIGATION  2007     Allergies  Allergen Reactions  . Codeine Hives and Itching  .  Sulfa Antibiotics Hives      Family History  Problem Relation Age of Onset  . Hypertension Mother   . Heart failure Father   . Hypertension Father   . Stroke Father      Social History Margaret [name] reports that she has never smoked. She has never used smokeless tobacco. Margaret [name] reports no history of alcohol use.   Review of Systems CONSTITUTIONAL: No weight loss, fever, chills, weakness or fatigue.  HEENT: Eyes: No visual loss, blurred vision, double vision or yellow sclerae.No hearing loss, sneezing, congestion, runny  nose or sore throat.  SKIN: No rash or itching.  CARDIOVASCULAR: per hpi RESPIRATORY: No shortness of breath, cough or sputum.  GASTROINTESTINAL: No anorexia, nausea, vomiting or diarrhea. No abdominal pain or blood.  GENITOURINARY: No burning on urination, no polyuria NEUROLOGICAL: No headache, dizziness, syncope, paralysis, ataxia, numbness or tingling in the extremities. No change in bowel or bladder control.  MUSCULOSKELETAL: No muscle, back pain, joint pain or stiffness.  LYMPHATICS: No enlarged nodes. No history of splenectomy.  PSYCHIATRIC: No history of depression or anxiety.  ENDOCRINOLOGIC: No reports of sweating, cold or heat intolerance. No polyuria or polydipsia.  Marland Kitchen   Physical Examination Today's Vitals   02/23/20 1344  BP: 112/70  Pulse: 83  Temp: 98 F (36.7 C)  TempSrc: Temporal  SpO2: 97%  Weight: 256 lb (116.1 kg)  Height: 5\' 3"  (1.6 m)   Body mass index is 45.35 kg/m.  Gen: resting comfortably, no acute distress HEENT: no scleral icterus, pupils equal round and reactive, no palptable cervical adenopathy,  CV: RRR, no m/r/g no jvd Resp: Clear to auscultation bilaterally GI: abdomen is soft, non-tender, non-distended, normal bowel sounds, no hepatosplenomegaly MSK: extremities are warm, no edema.  Skin: warm, no rash Neuro:  no focal deficits Psych: appropriate affect   Diagnostic Studies Jan 2020 echo IMPRESSIONS   1. The left ventricle has severely reduced systolic function, LVEF 0000000. The cavity size is severely increased. There is normal left ventricular hypertrophy. Echo evidence of restrictive diastolic filling patterns. Elevated left atrial and left  ventricular end-diastolic pressures. 2. Severe diffuse hypokinesis. 3. The right ventricle is in size. There is mildly reduced systolic function. Right ventricular systolic pressure is mildly elevated with an estimated pressure of 28.7 mmHg. 4. Severely dilated left atrial size. 5. The  mitral valve normal in structure. Regurgitation is mild by color flow Doppler. 6. Tricuspid regurgitation is mild. 7. The aortic valve tricuspid. Aortic valve regurgitation is mild by color flow Doppler.   01/2019 cath  There is severe left ventricular systolic dysfunction.  LV end diastolic pressure is normal.  The left ventricular ejection fraction is less than 25% by visual estimate.  1. Normal coronary anatomy. Left dominant circulation 2. Severe global LV dysfunction. EF estimated at 15-20%.  3. Normal LV filling pressures 4. Normal right heart pressures 5. Preserved cardiac output. Index 2.85 L/min/m squared.   03/2019 holter  Min HR 57, Max HR 145, Avg HR 92  No supraventricular ectopy  Occasional ventricular ectopy (5.6%), all as isolated PVCs and bigeminy/trigeminy  No significant arrhythmias    06/2019 echo IMPRESSIONS   1. The left ventricle has moderately reduced systolic function, with an ejection fraction of 35-40%. The cavity size was moderate to severely dilated. Left ventricular diastolic Doppler parameters are consistent with impaired relaxation. Left  ventricular diffuse hypokinesis. 2. The mitral valve is grossly normal. Mild thickening of the mitral valve leaflet. 3. The tricuspid valve is  grossly normal. 4. The aortic valve is tricuspid. Aortic valve regurgitation is mild by color flow Doppler. 5. The aortic root is normal in size and structure.    Assessment and Plan  1.Chronic systolicsystolic HF -no clear etiology by cath, MRI, or holter -medical therapy intiially limited by soft bp's  - repeat echo. If ongoing dysfunction may try to further titrate meds, likely entresto.    2. PSVT - no recent symptoms, continue current meds  3. PVCs -no symtoms, continue beta blocker.     F/u pending echo results      Arnoldo Lenis, M.D.

## 2020-02-28 ENCOUNTER — Encounter (HOSPITAL_COMMUNITY): Payer: Self-pay | Admitting: Emergency Medicine

## 2020-02-28 ENCOUNTER — Emergency Department (HOSPITAL_COMMUNITY)
Admission: EM | Admit: 2020-02-28 | Discharge: 2020-02-29 | Disposition: A | Discharge status: Home / Self Care | Payer: BC Managed Care – PPO | Attending: Emergency Medicine | Admitting: Emergency Medicine

## 2020-02-28 ENCOUNTER — Telehealth: Payer: Self-pay

## 2020-02-28 ENCOUNTER — Ambulatory Visit (HOSPITAL_BASED_OUTPATIENT_CLINIC_OR_DEPARTMENT_OTHER)
Admission: RE | Admit: 2020-02-28 | Discharge: 2020-02-28 | Disposition: A | Discharge status: Home / Self Care | Payer: BC Managed Care – PPO | Source: Ambulatory Visit | Attending: Cardiology | Admitting: Cardiology

## 2020-02-28 ENCOUNTER — Other Ambulatory Visit: Payer: Self-pay

## 2020-02-28 DIAGNOSIS — Z79899 Other long term (current) drug therapy: Secondary | ICD-10-CM | POA: Insufficient documentation

## 2020-02-28 DIAGNOSIS — M79602 Pain in left arm: Secondary | ICD-10-CM | POA: Diagnosis not present

## 2020-02-28 DIAGNOSIS — M79601 Pain in right arm: Secondary | ICD-10-CM | POA: Diagnosis not present

## 2020-02-28 DIAGNOSIS — I5022 Chronic systolic (congestive) heart failure: Secondary | ICD-10-CM | POA: Diagnosis not present

## 2020-02-28 DIAGNOSIS — R2233 Localized swelling, mass and lump, upper limb, bilateral: Secondary | ICD-10-CM | POA: Insufficient documentation

## 2020-02-28 NOTE — Telephone Encounter (Signed)
Pt called to c/o arm pain and heaviness that started on Friday. Denies SOB and chest pain. Denies strenuous activity  Denies discoloration to arms. Feels that it is coming from meds. Pt states she wanted to call our office before calling her PCP. Please advise.

## 2020-02-28 NOTE — Progress Notes (Signed)
Before the echo patient had told me that in both arms she was having a throbbing sensation on and off since Friday, Feb 26th. She spoke with her pharmacist who told her they didn't think it was from medicine and to speak with cardiologist. I asked patient did she want to go to the ED. She said she wanted to check with cardiology first. After the echo, I asked patient again did she feel like she needed to go to the ER. She seemed unsure. So I took patient to cardiologist office to see if nurse had a response/solution. While patient was speaking with Jarrett Soho the secretary I stated I'm going to put her echo in and will be back. When I went back the patient was gone. Secretary stated patient had went out after I left. I looked for her in the immediate area.

## 2020-02-28 NOTE — ED Triage Notes (Signed)
Pt having bilateral arm and hand pain since Friday.

## 2020-02-28 NOTE — Telephone Encounter (Signed)
Pt came in for Echo and informed Orvil Feil the Echo tech that she's having pain in her arms and hands, pt is thinking it may be coming from medications.   Please call [phone]

## 2020-02-28 NOTE — Progress Notes (Signed)
*  PRELIMINARY RESULTS* Echocardiogram 2D Echocardiogram has been performed.  Margaret [name] 02/28/2020, 3:40 PM

## 2020-02-28 NOTE — Telephone Encounter (Signed)
Pt has medication questions. Pt did not mention what questions Please call [phone]  Thanks renee

## 2020-02-29 ENCOUNTER — Emergency Department (HOSPITAL_COMMUNITY): Payer: BC Managed Care – PPO

## 2020-02-29 ENCOUNTER — Telehealth: Payer: Self-pay | Admitting: Cardiology

## 2020-02-29 DIAGNOSIS — R0989 Other specified symptoms and signs involving the circulatory and respiratory systems: Secondary | ICD-10-CM | POA: Diagnosis not present

## 2020-02-29 DIAGNOSIS — M79601 Pain in right arm: Secondary | ICD-10-CM | POA: Diagnosis not present

## 2020-02-29 DIAGNOSIS — M79603 Pain in arm, unspecified: Secondary | ICD-10-CM | POA: Diagnosis not present

## 2020-02-29 DIAGNOSIS — M79602 Pain in left arm: Secondary | ICD-10-CM | POA: Diagnosis not present

## 2020-02-29 LAB — CBC WITH DIFFERENTIAL/PLATELET
Abs Immature Granulocytes: 0.02 10*3/uL (ref 0.00–0.07)
Basophils Absolute: 0 10*3/uL (ref 0.0–0.1)
Basophils Relative: 1 %
Eosinophils Absolute: 0.1 10*3/uL (ref 0.0–0.5)
Eosinophils Relative: 2 %
HCT: 41.3 % (ref 36.0–46.0)
Hemoglobin: 13 g/dL (ref 12.0–15.0)
Immature Granulocytes: 0 %
Lymphocytes Relative: 24 %
Lymphs Abs: 1.8 10*3/uL (ref 0.7–4.0)
MCH: 28.6 pg (ref 26.0–34.0)
MCHC: 31.5 g/dL (ref 30.0–36.0)
MCV: 91 fL (ref 80.0–100.0)
Monocytes Absolute: 0.5 10*3/uL (ref 0.1–1.0)
Monocytes Relative: 7 %
Neutro Abs: 5.1 10*3/uL (ref 1.7–7.7)
Neutrophils Relative %: 66 %
Platelets: 200 10*3/uL (ref 150–400)
RBC: 4.54 MIL/uL (ref 3.87–5.11)
RDW: 13.8 % (ref 11.5–15.5)
WBC: 7.6 10*3/uL (ref 4.0–10.5)
nRBC: 0 % (ref 0.0–0.2)

## 2020-02-29 LAB — BASIC METABOLIC PANEL
Anion gap: 9 (ref 5–15)
BUN: 17 mg/dL (ref 6–20)
CO2: 24 mmol/L (ref 22–32)
Calcium: 8.8 mg/dL — ABNORMAL LOW (ref 8.9–10.3)
Chloride: 104 mmol/L (ref 98–111)
Creatinine, Ser: 0.65 mg/dL (ref 0.44–1.00)
GFR calc Af Amer: >60 mL/min (ref >60)
GFR calc non Af Amer: >60 mL/min (ref >60)
Glucose, Bld: 122 mg/dL — ABNORMAL HIGH (ref 70–99)
Potassium: 3.7 mmol/L (ref 3.5–5.1)
Sodium: 137 mmol/L (ref 135–145)

## 2020-02-29 LAB — TROPONIN I (HIGH SENSITIVITY)
Troponin I (High Sensitivity): 3 ng/L (ref <18)
Troponin I (High Sensitivity): 3 ng/L (ref <18)

## 2020-02-29 LAB — CK: Total CK: 37 U/L — ABNORMAL LOW (ref 38–234)

## 2020-02-29 MED ORDER — DIPHENHYDRAMINE HCL 25 MG PO CAPS
25.0000 mg | ORAL_CAPSULE | Freq: Once | ORAL | Status: AC
  Administered 2020-02-29: 25 mg via ORAL
  Filled 2020-02-29: qty 1

## 2020-02-29 MED ORDER — NAPROXEN 500 MG PO TABS: 500.0000 mg | ORAL_TABLET | Freq: Two times a day (BID) | ORAL | 0 refills | Status: DC | PRN

## 2020-02-29 MED ORDER — PREDNISONE 50 MG PO TABS: ORAL_TABLET | ORAL | 0 refills | Status: DC

## 2020-02-29 MED ORDER — KETOROLAC TROMETHAMINE 30 MG/ML IJ SOLN
30.0000 mg | Freq: Once | INTRAMUSCULAR | Status: AC
  Administered 2020-02-29: 30 mg via INTRAMUSCULAR
  Filled 2020-02-29: qty 1

## 2020-02-29 MED ORDER — PREDNISONE 50 MG PO TABS
60.0000 mg | ORAL_TABLET | Freq: Once | ORAL | Status: AC
  Administered 2020-02-29: 60 mg via ORAL
  Filled 2020-02-29: qty 1

## 2020-02-29 NOTE — Telephone Encounter (Signed)
Patient with ER visit yesterday with arm heaviness. No specific chest pain or SOB. ER physician also noticed some lip swelling, entresto was stopped  Throbbing pain both arms that started on Friday. Pain from elbow down into both hands. No neck pains, no chest pains. No SOB. Pain would come and go, would last about 10-15 minutes. Worst with movement.   Also noticed during ER visit some lip swelling. No stridor or SOB, she was asked to d/c entresto prior to discharge.   Arm pains were not cardiac related, improved with toradol suggesting inflammatory process, defer to pcp. Unclear if lip swelling was related to entresto. With her improved LVEF not as imperative she remain on entresto. We will d/c entresto, let things calm down and reevaluate in 4 weeks, likely would start ARB at that time.   Carlyle Dolly MD

## 2020-02-29 NOTE — Telephone Encounter (Signed)
I spoke with patient. I asked her to stop entresto. I also updated her on her echo results from yesterday. She needs a virtual visit with me in 1 month   Margaret Abts MD

## 2020-02-29 NOTE — Telephone Encounter (Signed)
Appt made, Entresto stopped.

## 2020-02-29 NOTE — Telephone Encounter (Signed)
Patient returned call to see if Dr. Harl Bowie had made a decision.

## 2020-02-29 NOTE — ED Provider Notes (Signed)
[facility] Provider Note   CSN: [id number] Arrival date & time: 02/28/20  2330     History Chief Complaint  Patient presents with  . Arm Pain    bilateral    Margaret [name] is a 44 y.o. female with a history significant for SVT with ablation procedure and acute CHF presenting for evaluation of bilateral arm pain.  She describes intermittent episodes of radiating pain which started in her left upper arm and has been intermittent for the past 5 days.  Initially she thought her symptoms may be from muscle strain as she has some muscle soreness but also describes throbbing pain that shoots into her hands.  Today her symptoms also involve her right arm as well, similar pattern of throbbing pain sometimes in her upper arm sometimes skips to her hands.  She also endorses a heavy sensation in her arms and feels like her forearms and hands are swollen.  She denies numbness or tingling in her arms, but states that since she has arrived here the palms of her hands have been itchy.  She has found no alleviators or triggers for her symptoms.  She denies neck pain or injury, also denies chest pain, sob, n/v, palpitations or diaphoresis.   Of note, she had an echocardiogram yesterday revealing a EF of 45-50%.  She also had a normal cardiac cath with no CAD 02/09/19.   The history is provided by the patient.       Past Medical History:  Diagnosis Date  . SVT (supraventricular tachycardia) [facility])     Patient Active Problem List   Diagnosis Date Noted  . Acute systolic CHF (congestive heart failure) ([place]) 02/09/2019  . SVT (supraventricular tachycardia) ([place]) 01/09/2016  . PSVT (paroxysmal supraventricular tachycardia) ([place]) 05/14/2015    Past Surgical History:  Procedure Laterality Date  . APPENDECTOMY  1981  . CESAREAN SECTION  1999; 2007  . ELECTROPHYSIOLOGIC STUDY N/A 01/09/2016   Procedure: SVT Ablation;  Surgeon: Evans Lance, MD;  Location: [facility];   Service: Cardiovascular;  Laterality: N/A;  . LAPAROSCOPIC CHOLECYSTECTOMY  1998  . MICROLARYNGOSCOPY Left 04/26/2018   Procedure: MICROLARYNGOSCOPY WITH EXCISION OF VALLECULER MASS;  Surgeon: Leta Baptist, MD;  Location: [facility];  Service: ENT;  Laterality: Left;  . RIGHT/LEFT HEART CATH AND CORONARY ANGIOGRAPHY N/A 02/09/2019   Procedure: RIGHT/LEFT HEART CATH AND CORONARY ANGIOGRAPHY;  Surgeon: Martinique, Peter M, MD;  Location: [facility];  Service: Cardiovascular;  Laterality: N/A;  . SUPRAVENTRICULAR TACHYCARDIA ABLATION  01/09/2016  . TUBAL LIGATION  2007     OB History    Gravida  2   Para  2   Term  2   Preterm      AB      Living        SAB      TAB      Ectopic      Multiple      Live Births              Family History  Problem Relation Age of Onset  . Hypertension Mother   . Heart failure Father   . Hypertension Father   . Stroke Father     Social History   Tobacco Use  . Smoking status: Never Smoker  . Smokeless tobacco: Never Used  Substance Use Topics  . Alcohol use: No  . Drug use: No    Home Medications Prior to Admission medications  Medication Sig Start Date End Date Taking? Authorizing Provider  acetaminophen (TYLENOL) 500 MG tablet Take 1,000 mg by mouth every 6 (six) hours as needed for mild pain or headache.    [provider]  carvedilol (COREG) 6.25 MG tablet Take 6.25 mg every morning (1 tablet), take 12.5 mg every evening (2 tablets). 11/08/19   Arnoldo Lenis, MD  ENTRESTO 49-51 MG TAKE ONE TABLET BY MOUTH TWICE A DAY 12/01/19   Arnoldo Lenis, MD  furosemide (LASIX) 40 MG tablet TAKE 40 MG DAILY. MAY TAKE AN ADDITIONAL 40 MG DAILY AS NEEDED FOR SHORTNESS OF BREATH OR SWELLING. 07/05/19   Arnoldo Lenis, MD  potassium chloride (K-DUR) 10 MEQ tablet Take 1 tablet by mouth daily. 01/26/19   [provider]  potassium chloride SA (K-DUR) 20 MEQ tablet Take 1 tablet (20 mEq total) by  mouth daily. 07/21/19 02/23/20  Arnoldo Lenis, MD  spironolactone (ALDACTONE) 25 MG tablet TAKE ONE-HALF TABLET (12.5MG  TOTAL) BY MOUTH DAILY 02/23/20   Arnoldo Lenis, MD    Allergies    Codeine and Sulfa antibiotics  Review of Systems   Review of Systems  Constitutional: Negative for chills and fever.  HENT: Negative for congestion and sore throat.   Eyes: Negative.   Respiratory: Negative for cough, chest tightness and shortness of breath.   Cardiovascular: Negative for chest pain, palpitations and leg swelling.  Gastrointestinal: Negative for abdominal pain and nausea.  Genitourinary: Negative.   Musculoskeletal: Positive for arthralgias. Negative for joint swelling and neck pain.  Skin: Negative.  Negative for rash and wound.       Hand pruritis   Neurological: Negative for dizziness, weakness, light-headedness, numbness and headaches.  Psychiatric/Behavioral: Negative.     Physical Exam Updated Vital Signs BP (!) 135/93 (BP Location: Right Arm)   Pulse 91   Temp 98.3 F (36.8 C) (Oral)   Resp 18   Ht 5\' 3"  (1.6 m)   Wt 111.6 kg   SpO2 97%   BMI 43.58 kg/m   Physical Exam Vitals and nursing note reviewed.  Constitutional:      Appearance: She is well-developed.  HENT:     Head: Normocephalic and atraumatic.  Eyes:     Conjunctiva/sclera: Conjunctivae normal.  Cardiovascular:     Rate and Rhythm: Normal rate and regular rhythm.     Pulses: Normal pulses.          Radial pulses are 2+ on the right side and 2+ on the left side.     Heart sounds: Normal heart sounds.     Comments: Less than 2 sec cap refill in fingertips.  No arm or hand edema appreciated.  Pulmonary:     Effort: Pulmonary effort is normal.     Breath sounds: Normal breath sounds. No wheezing.  Musculoskeletal:        General: Normal range of motion.     Cervical back: Normal range of motion and neck supple. No rigidity or tenderness.  Skin:    General: Skin is warm and dry.    Neurological:     General: No focal deficit present.     Mental Status: She is alert.     Sensory: Sensation is intact.     Motor: Motor function is intact.     Comments: Equal grip strength.      ED Results / Procedures / Treatments   Labs (all labs ordered are listed, but only abnormal results are displayed) Labs Reviewed  CBC WITH DIFFERENTIAL/PLATELET  BASIC METABOLIC PANEL  TROPONIN I (HIGH SENSITIVITY)    EKG EKG Interpretation  Date/Time:  Wednesday February 29 2020 00:33:54 EST Ventricular Rate:  75 PR Interval:    QRS Duration: 101 QT Interval:  379 QTC Calculation: 424 R Axis:   29 Text Interpretation: Sinus rhythm Posterior infarct, old Borderline repolarization abnormality No significant change was found Confirmed by Ezequiel Essex [phone]) on 02/29/2020 12:46:34 AM   Radiology ECHOCARDIOGRAM COMPLETE  Result Date: 02/28/2020    ECHOCARDIOGRAM REPORT   Patient Name:   MAYMIE VORNDRAN [facility] Date of Exam: 02/28/2020 Medical Rec #:  [id number]       Height:       63.0 in Accession #:    [id number]      Weight:       256.0 lb Date of Birth:  [birth year]       BSA:          2.148 m Patient Age:    33 years        BP:           133/79 mmHg Patient Gender: F               HR:           84 bpm. Exam Location:  [facility] Procedure: 2D Echo Indications:    Chronic systolic heart failure ([place]) [428.22.ICD-9-CM  History:        Patient has prior history of Echocardiogram examinations, most                 recent 07/13/2019. CHF; Risk Factors:Non-Smoker. PSVT, SVT.  Sonographer:    Leavy Cella RDCS (AE) Referring Phys: [id number] [place]  1. Left ventricular ejection fraction, by estimation, is 45 to 50%. The left ventricle has mildly decreased function. The left ventricle has no regional wall motion abnormalities. Left ventricular diastolic parameters were normal.  2. Right ventricular systolic function is normal. The right ventricular size is normal. There is normal  pulmonary artery systolic pressure.  3. The mitral valve is normal in structure and function. Trivial mitral valve regurgitation. No evidence of mitral stenosis.  4. The aortic valve has an indeterminant number of cusps. Aortic valve regurgitation is mild. No aortic stenosis is present.  5. The inferior vena cava is normal in size with greater than 50% respiratory variability, suggesting right atrial pressure of 3 mmHg. FINDINGS  Left Ventricle: Left ventricular ejection fraction, by estimation, is 45 to 50%. The left ventricle has mildly decreased function. The left ventricle has no regional wall motion abnormalities. The left ventricular internal cavity size was normal in size. There is no left ventricular hypertrophy. Left ventricular diastolic parameters were normal. Right Ventricle: The right ventricular size is normal. No increase in right ventricular wall thickness. Right ventricular systolic function is normal. There is normal pulmonary artery systolic pressure. The tricuspid regurgitant velocity is 1.92 m/s, and  with an assumed right atrial pressure of 10 mmHg, the estimated right ventricular systolic pressure is [id number] mmHg. Left Atrium: Left atrial size was normal in size. Right Atrium: Right atrial size was normal in size. Pericardium: There is no evidence of pericardial effusion. Mitral Valve: The mitral valve is normal in structure and function. Trivial mitral valve regurgitation. No evidence of mitral valve stenosis. Tricuspid Valve: The tricuspid valve is normal in structure. Tricuspid valve regurgitation is trivial. No evidence of tricuspid stenosis. Aortic Valve: The aortic valve has an indeterminant number of cusps.  Aortic valve regurgitation is mild. Aortic regurgitation PHT measures 444 msec. No aortic stenosis is present. Aortic valve mean gradient measures 7.0 mmHg. Aortic valve peak gradient measures 12.5 mmHg. Aortic valve area, by VTI measures 1.79 cm. Pulmonic Valve: The pulmonic valve was  not well visualized. Pulmonic valve regurgitation is not visualized. No evidence of pulmonic stenosis. Aorta: The aortic root is normal in size and structure. Pulmonary Artery: Indeterminant PASP, inadequate TR jet. Venous: The inferior vena cava is normal in size with greater than 50% respiratory variability, suggesting right atrial pressure of 3 mmHg. IAS/Shunts: No atrial level shunt detected by color flow Doppler.  LEFT VENTRICLE PLAX 2D LVIDd:         5.92 cm  Diastology LVIDs:         4.20 cm  LV e' lateral:   13.20 cm/s LV PW:         1.00 cm  LV E/e' lateral: 5.9 LV IVS:        1.03 cm  LV e' medial:    6.20 cm/s LVOT diam:     2.10 cm  LV E/e' medial:  12.6 LV SV:         62 LV SV Index:   29 LVOT Area:     3.46 cm  RIGHT VENTRICLE RV S prime:     13.20 cm/s TAPSE (M-mode): 2.0 cm LEFT ATRIUM             Index       RIGHT ATRIUM           Index LA diam:        3.70 cm 1.72 cm/m  RA Area:     14.80 cm LA Vol (A2C):   56.4 ml 26.26 ml/m RA Volume:   41.30 ml  19.23 ml/m LA Vol (A4C):   39.0 ml 18.16 ml/m LA Biplane Vol: 46.4 ml 21.61 ml/m  AORTIC VALVE AV Area (Vmax):    1.84 cm AV Area (Vmean):   1.72 cm AV Area (VTI):     1.79 cm AV Vmax:           176.46 cm/s AV Vmean:          122.995 cm/s AV VTI:            0.346 m AV Peak Grad:      12.5 mmHg AV Mean Grad:      7.0 mmHg LVOT Vmax:         93.62 cm/s LVOT Vmean:        61.193 cm/s LVOT VTI:          0.179 m LVOT/AV VTI ratio: 0.52 AI PHT:            444 msec  AORTA Ao Root diam: 3.10 cm MITRAL VALVE               TRICUSPID VALVE MV Area (PHT): 5.42 cm    TR Peak grad:   14.7 mmHg MV Decel Time: 140 msec    TR Vmax:        192.00 cm/s MR Peak grad: 47.6 mmHg MR Vmax:      345.00 cm/s  SHUNTS MV E velocity: 78.10 cm/s  Systemic VTI:  0.18 m MV A velocity: 63.10 cm/s  Systemic Diam: 2.10 cm MV E/A ratio:  1.24 Carlyle Dolly MD Electronically signed by Carlyle Dolly MD Signature Date/Time: 02/28/2020/3:48:21 PM    Final      Procedures Procedures (including critical care time)  Medications Ordered in ED Medications  diphenhydrAMINE (BENADRYL) capsule 25 mg (25 mg Oral Given 02/29/20 0028)    ED Course  I have reviewed the triage vital signs and the nursing notes.  Pertinent labs & imaging results that were available during my care of the patient were reviewed by me and considered in my medical decision making (see chart for details).    MDM Rules/Calculators/A&P                      Pt with bilateral arm pain of unclear etiology, pending troponins and c spine imaging to rule out atypical cardiac presentation vs cervical radiculopathy as source of bilateral sx.    Dr. Wyvonnia Dusky assumes care of pt.  Final Clinical Impression(s) / ED Diagnoses Final diagnoses:  Bilateral arm pain    Rx / DC Orders ED Discharge Orders    None       Landis Martins 02/29/20 0124    Ezequiel Essex, MD 02/29/20 [id number]

## 2020-02-29 NOTE — Discharge Instructions (Addendum)
It does not appear that your arm pain is coming from your heart.  You should follow-up with your doctor this week.  Call Dr. Harl Bowie in the morning.  You should stop taking your Entresto until you speak with Dr. Harl Bowie because this could be contributing to your lip swelling. Return to the ED with chest pain, shortness of breath, tongue swelling, lip swelling, difficulty breathing, difficulty swallowing or other concerns.

## 2020-02-29 NOTE — ED Provider Notes (Signed)
Patient with history of SVT, nonischemic cardiomyopathy here with intermittent bilateral arm pain for the past 5 days.  She describes soreness in her muscles involving her upper arms and forearms that comes and goes lasting for several minutes to hours at a time.  Described as soreness and heaviness.  Has involved mostly left arm but has involved right arm as well.  No chest pain or shortness of breath.  Clean coronaries by catheterization in 2020.  Improvement of her previously low EF on last electrocardiogram  She is in no distress.  Regular rate and rhythm, lungs clear, 5/5 strength throughout, equal radial pulses bilaterally  EKG is nonischemic.  While in the ED, patient developed some swelling to her upper lip.  She denies any difficulty breathing or difficulty swallowing.  There is no tongue swelling or swelling of the floor of her mouth.  She does take Entresto which contains an ARB.  Troponin negative x2.  Patient feels much improved after receiving Toradol.  She has no weakness in her arms or legs.  Her CK is normal.  No progression of her lip swelling.  There is minimal improved.  She is instructed to hold her Delene Loll and call her cardiologist in the morning as this may represent angioedema.  Return to the ED with difficulty breathing, difficulty swallowing, chest pain, shortness of breath, any other concerns.   Ezequiel Essex, MD 02/29/20 [phone]

## 2020-03-07 DIAGNOSIS — M79601 Pain in right arm: Secondary | ICD-10-CM | POA: Diagnosis not present

## 2020-03-27 ENCOUNTER — Ambulatory Visit (HOSPITAL_COMMUNITY)
Admission: RE | Admit: 2020-03-27 | Discharge: 2020-03-27 | Disposition: A | Discharge status: Home / Self Care | Payer: BC Managed Care – PPO | Source: Ambulatory Visit | Attending: Internal Medicine | Admitting: Internal Medicine

## 2020-03-27 ENCOUNTER — Other Ambulatory Visit (HOSPITAL_COMMUNITY): Payer: Self-pay | Admitting: Internal Medicine

## 2020-03-27 ENCOUNTER — Encounter (HOSPITAL_COMMUNITY): Payer: Self-pay

## 2020-03-27 ENCOUNTER — Other Ambulatory Visit: Payer: Self-pay

## 2020-03-27 DIAGNOSIS — R200 Anesthesia of skin: Secondary | ICD-10-CM

## 2020-03-27 DIAGNOSIS — M19012 Primary osteoarthritis, left shoulder: Secondary | ICD-10-CM | POA: Diagnosis not present

## 2020-03-27 DIAGNOSIS — M542 Cervicalgia: Secondary | ICD-10-CM | POA: Diagnosis not present

## 2020-04-04 ENCOUNTER — Telehealth (INDEPENDENT_AMBULATORY_CARE_PROVIDER_SITE_OTHER): Payer: BC Managed Care – PPO | Admitting: Cardiology

## 2020-04-04 ENCOUNTER — Encounter: Payer: Self-pay | Admitting: Cardiology

## 2020-04-04 VITALS — BP 112/70 | HR 83 | Ht 63.0 in | Wt 256.0 lb

## 2020-04-04 DIAGNOSIS — I493 Ventricular premature depolarization: Secondary | ICD-10-CM

## 2020-04-04 DIAGNOSIS — I5022 Chronic systolic (congestive) heart failure: Secondary | ICD-10-CM

## 2020-04-04 DIAGNOSIS — I471 Supraventricular tachycardia: Secondary | ICD-10-CM

## 2020-04-04 MED ORDER — LOSARTAN POTASSIUM 25 MG PO TABS: 12.5000 mg | ORAL_TABLET | Freq: Every day | ORAL | 3 refills | Status: DC

## 2020-04-04 MED ORDER — CARVEDILOL 6.25 MG PO TABS: 6.2500 mg | ORAL_TABLET | Freq: Two times a day (BID) | ORAL | 3 refills | Status: DC

## 2020-04-04 NOTE — Addendum Note (Signed)
Addended by: Debbora Lacrosse R on: 04/04/2020 02:36 PM   Modules accepted: Orders

## 2020-04-04 NOTE — Progress Notes (Signed)
Virtual Visit via Telephone Note   This visit type was conducted due to national recommendations for restrictions regarding the COVID-19 Pandemic (e.g. social distancing) in an effort to limit this patient's exposure and mitigate transmission in our community.  Due to her co-morbid illnesses, this patient is at least at moderate risk for complications without adequate follow up.  This format is felt to be most appropriate for this patient at this time.  The patient did not have access to video technology/had technical difficulties with video requiring transitioning to audio format only (telephone).  All issues noted in this document were discussed and addressed.  No physical exam could be performed with this format.  Please refer to the patient's chart for her  consent to telehealth for [facility].   The patient was identified using 2 identifiers.  Date:  04/04/2020   ID:  Margaret [name], DOB Jun 23, 1976, MRN [id number]  Patient Location: Home Provider Location: Office  PCP:  Celene Squibb, MD  Cardiologist:  Carlyle Dolly, MD  Electrophysiologist:  None   Evaluation Performed:  Follow-Up Visit  Chief Complaint:  Follow up  History of Present Illness:    Margaret [name] is a 44 y.o. female seen today forfollow up of the following medical problems.  1.Chronicsystolic HF -several month history of progressing SOB/DOE, cough, orthopnea. No significant LE edema. Has had some adbomdinal distension   - Jan 2020 echo ordered by pcp showed LVEF 15-20%, restricitive diastolic function, mild RV dysfunction. - 01/2019 cath: no significant CAD. Mean PA 21, PCWP 11, CI 2.85 - cardiac MRIno definitive evidence of myocarditis or infiltrating disease 03/2019 holter: 5.6% PVC burden   - no EtOH, no drug use.  - Father with MI in early 4s, no other family history of heart disease. Maternal grandfather early 58shad MI. No significant FH of heart failure per her report.    06/2019 echo LVEF 35-40% 02/2020 echo LVEF [id number], normal diastolic function - during 02/2020 ER visit noted some lip swelling, entresto was stopped.  - swelling has resolved.   -she reports higher doses of coreg caused stiffiness and itching. She cut back on coreg on her 6.25mg  bid, feels better.    2. Arm pains - ER visit 02/2020 with symptoms, improved with toradol   3. PSVT - s/p ablation Jan 2017 by Dr Lovena Le - denies any palpitations.    3. PVCs -noted on prior EKGs 03/2019 holter: 5.6% PVC burden  - no recent symptoms   4. COVID infection - she was infected in early January - mild course - mother was hospitalized at [facility] with covid, was on ventilator but was able to recover    Not interested in covid vaccine.   The patient does not have symptoms concerning for COVID-19 infection (fever, chills, cough, or new shortness of breath).    Past Medical History:  Diagnosis Date  . SVT (supraventricular tachycardia) (HCC)    Past Surgical History:  Procedure Laterality Date  . APPENDECTOMY  1981  . CESAREAN SECTION  1999; 2007  . ELECTROPHYSIOLOGIC STUDY N/A 01/09/2016   Procedure: SVT Ablation;  Surgeon: Evans Lance, MD;  Location: [facility];  Service: Cardiovascular;  Laterality: N/A;  . LAPAROSCOPIC CHOLECYSTECTOMY  1998  . MICROLARYNGOSCOPY Left 04/26/2018   Procedure: MICROLARYNGOSCOPY WITH EXCISION OF VALLECULER MASS;  Surgeon: Leta Baptist, MD;  Location: [facility];  Service: ENT;  Laterality: Left;  . RIGHT/LEFT HEART CATH AND CORONARY ANGIOGRAPHY N/A 02/09/2019  Procedure: RIGHT/LEFT HEART CATH AND CORONARY ANGIOGRAPHY;  Surgeon: Martinique, Peter M, MD;  Location: [facility];  Service: Cardiovascular;  Laterality: N/A;  . SUPRAVENTRICULAR TACHYCARDIA ABLATION  01/09/2016  . TUBAL LIGATION  2007     Current Meds  Medication Sig  . acetaminophen (TYLENOL) 500 MG tablet Take 1,000 mg by mouth every 6 (six) hours  as needed for mild pain or headache.  . carvedilol (COREG) 6.25 MG tablet Take 6.25 mg every morning (1 tablet), take 12.5 mg every evening (2 tablets). (Patient taking differently: Take 6.25 mg every morning (1 tablet), 6.25 IN EVENING.)  . furosemide (LASIX) 40 MG tablet TAKE 40 MG DAILY. MAY TAKE AN ADDITIONAL 40 MG DAILY AS NEEDED FOR SHORTNESS OF BREATH OR SWELLING.  Marland Kitchen potassium chloride (K-DUR) 10 MEQ tablet Take 1 tablet by mouth daily.  . potassium chloride SA (K-DUR) 20 MEQ tablet Take 1 tablet (20 mEq total) by mouth daily.  . predniSONE (DELTASONE) 50 MG tablet 1 tablet PO daily  . spironolactone (ALDACTONE) 25 MG tablet TAKE ONE-HALF TABLET (12.5MG  TOTAL) BY MOUTH DAILY     Allergies:   Codeine and Sulfa antibiotics   Social History   Tobacco Use  . Smoking status: Never Smoker  . Smokeless tobacco: Never Used  Substance Use Topics  . Alcohol use: No  . Drug use: No     Family Hx: The patient's family history includes Heart failure in her father; Hypertension in her father and mother; Stroke in her father.  ROS:   Please see the history of present illness.     All other systems reviewed and are negative.   Prior CV studies:   The following studies were reviewed today:  Jan 2020 echo IMPRESSIONS   1. The left ventricle has severely reduced systolic function, LVEF 0000000. The cavity size is severely increased. There is normal left ventricular hypertrophy. Echo evidence of restrictive diastolic filling patterns. Elevated left atrial and left  ventricular end-diastolic pressures. 2. Severe diffuse hypokinesis. 3. The right ventricle is in size. There is mildly reduced systolic function. Right ventricular systolic pressure is mildly elevated with an estimated pressure of 28.7 mmHg. 4. Severely dilated left atrial size. 5. The mitral valve normal in structure. Regurgitation is mild by color flow Doppler. 6. Tricuspid regurgitation is mild. 7. The aortic  valve tricuspid. Aortic valve regurgitation is mild by color flow Doppler.   01/2019 cath  There is severe left ventricular systolic dysfunction.  LV end diastolic pressure is normal.  The left ventricular ejection fraction is less than 25% by visual estimate.  1. Normal coronary anatomy. Left dominant circulation 2. Severe global LV dysfunction. EF estimated at 15-20%.  3. Normal LV filling pressures 4. Normal right heart pressures 5. Preserved cardiac output. Index 2.85 L/min/m squared.   03/2019 holter  Min HR 57, Max HR 145, Avg HR 92  No supraventricular ectopy  Occasional ventricular ectopy (5.6%), all as isolated PVCs and bigeminy/trigeminy  No significant arrhythmias    06/2019 echo IMPRESSIONS   1. The left ventricle has moderately reduced systolic function, with an ejection fraction of 35-40%. The cavity size was moderate to severely dilated. Left ventricular diastolic Doppler parameters are consistent with impaired relaxation. Left  ventricular diffuse hypokinesis. 2. The mitral valve is grossly normal. Mild thickening of the mitral valve leaflet. 3. The tricuspid valve is grossly normal. 4. The aortic valve is tricuspid. Aortic valve regurgitation is mild by color flow Doppler. 5. The aortic root is  normal in size and structure.   02/2020 echo IMPRESSIONS    1. Left ventricular ejection fraction, by estimation, is 45 to 50%. The  left ventricle has mildly decreased function. The left ventricle has no  regional wall motion abnormalities. Left ventricular diastolic parameters  were normal.  2. Right ventricular systolic function is normal. The right ventricular  size is normal. There is normal pulmonary artery systolic pressure.  3. The mitral valve is normal in structure and function. Trivial mitral  valve regurgitation. No evidence of mitral stenosis.  4. The aortic valve has an indeterminant number of cusps. Aortic valve   regurgitation is mild. No aortic stenosis is present.  5. The inferior vena cava is normal in size with greater than 50%  respiratory variability, suggesting right atrial pressure of 3 mmHg.   FINDINGS  Left Ventricle: Left ventricular ejection fraction, by estimation, is 45  to 50%. The left ventricle has mildly decreased function. The left  ventricle has no regional wall motion abnormalities. The left ventricular  internal cavity size was normal in  size. There is no left ventricular hypertrophy. Left ventricular diastolic  parameters were normal.   Right Ventricle: The right ventricular size is normal. No increase in  right ventricular wall thickness. Right ventricular systolic function is  normal. There is normal pulmonary artery systolic pressure. The tricuspid  regurgitant velocity is 1.92 m/s, and  with an assumed right atrial pressure of 10 mmHg, the estimated right  ventricular systolic pressure is Q000111Q mmHg.   Labs/Other Tests and Data Reviewed:    EKG:  No ECG reviewed.  Recent Labs: 07/18/2019: B Natriuretic Peptide 90.0; Magnesium 2.0 02/29/2020: BUN 17; Creatinine, Ser 0.65; Hemoglobin 13.0; Platelets 200; Potassium 3.7; Sodium 137   Recent Lipid Panel No results found for: CHOL, TRIG, HDL, CHOLHDL, LDLCALC, LDLDIRECT  Wt Readings from Last 3 Encounters:  04/04/20 256 lb (116.1 kg)  02/28/20 246 lb (111.6 kg)  02/23/20 256 lb (116.1 kg)     Objective:    Vital Signs:  BP 112/70   Pulse 83   Ht 5\' 3"  (1.6 m)   Wt 256 lb (116.1 kg)   BMI 45.35 kg/m    Normal affect. Normal speech pattern and tone. Comfortable, no apparent distress. No audible signs of SOB or wheezing.   ASSESSMENT & PLAN:    1.Chronic systolicsystolic HF -no clear etiology by cath, MRI, or holter -most recent echo shows LVEF has improved to 45-50% - side effects on higher doses of coreg, toleraint 6.25mg  bid and will continue - lip swelling on entresto, stopped and resolved.  Start losaratn 12.5mg  daily.    2. PSVT - no symptoms, continue beta blocker  3. PVCs -no recenty symptoms, continue to monitor.     COVID-19 Education: The signs and symptoms of COVID-19 were discussed with the patient and how to seek care for testing (follow up with PCP or arrange E-visit).  The importance of social distancing was discussed today.  Time:   Today, I have spent 20 minutes with the patient with telehealth technology discussing the above problems.     Medication Adjustments/Labs and Tests Ordered: Current medicines are reviewed at length with the patient today.  Concerns regarding medicines are outlined above.   Tests Ordered: No orders of the defined types were placed in this encounter.   Medication Changes: No orders of the defined types were placed in this encounter.   Follow Up:  Either In Person or Virtual in 6 month(s)  Merrily Pew, MD  04/04/2020 12:42 PM    [place]

## 2020-04-04 NOTE — Patient Instructions (Signed)
Medication Instructions:  TAKE COREG 6.25 MG - TWO TIMES DAILY   START LOSARTAN 12.5 MG DAILY  Labwork: NONE  Testing/Procedures: NONE  Follow-Up: Your physician wants you to follow-up in: 6 MONTHS. You will receive a reminder letter in the mail two months in advance. If you don't receive a letter, please call our office to schedule the follow-up appointment.   Any Other Special Instructions Will Be Listed Below (If Applicable).     If you need a refill on your cardiac medications before your next appointment, please call your pharmacy.

## 2020-04-27 DIAGNOSIS — D225 Melanocytic nevi of trunk: Secondary | ICD-10-CM | POA: Diagnosis not present

## 2020-04-27 DIAGNOSIS — D1801 Hemangioma of skin and subcutaneous tissue: Secondary | ICD-10-CM | POA: Diagnosis not present

## 2020-04-27 DIAGNOSIS — L570 Actinic keratosis: Secondary | ICD-10-CM | POA: Diagnosis not present

## 2020-06-14 ENCOUNTER — Telehealth: Payer: Self-pay

## 2020-06-14 NOTE — Telephone Encounter (Signed)
I advise patient to not take losartan and will FYI Dr.Branch

## 2020-06-14 NOTE — Telephone Encounter (Signed)
Stop losartan and list as allergy. No replacement at this time.   J Khing Belcher MD

## 2020-06-14 NOTE — Telephone Encounter (Signed)
LM for pt that she will stop losartan no new med order.I listed in allergies

## 2020-06-14 NOTE — Telephone Encounter (Signed)
LOSARTAN IS CAUSING LIP SWELLING PER PT  PLEASE CALL [phone]   THANKS RENEE

## 2020-08-08 DIAGNOSIS — I5033 Acute on chronic diastolic (congestive) heart failure: Secondary | ICD-10-CM | POA: Diagnosis not present

## 2020-08-08 DIAGNOSIS — Z6841 Body Mass Index (BMI) 40.0 and over, adult: Secondary | ICD-10-CM | POA: Diagnosis not present

## 2020-08-08 DIAGNOSIS — R7301 Impaired fasting glucose: Secondary | ICD-10-CM | POA: Diagnosis not present

## 2020-08-08 DIAGNOSIS — R7303 Prediabetes: Secondary | ICD-10-CM | POA: Diagnosis not present

## 2020-08-08 DIAGNOSIS — M79671 Pain in right foot: Secondary | ICD-10-CM | POA: Diagnosis not present

## 2020-08-13 ENCOUNTER — Other Ambulatory Visit: Payer: Self-pay | Admitting: Cardiology

## 2020-08-14 DIAGNOSIS — I5033 Acute on chronic diastolic (congestive) heart failure: Secondary | ICD-10-CM | POA: Diagnosis not present

## 2020-08-14 DIAGNOSIS — R945 Abnormal results of liver function studies: Secondary | ICD-10-CM | POA: Diagnosis not present

## 2020-08-14 DIAGNOSIS — E782 Mixed hyperlipidemia: Secondary | ICD-10-CM | POA: Diagnosis not present

## 2020-08-14 DIAGNOSIS — E1169 Type 2 diabetes mellitus with other specified complication: Secondary | ICD-10-CM | POA: Diagnosis not present

## 2020-08-31 ENCOUNTER — Other Ambulatory Visit: Payer: Self-pay | Admitting: Cardiology

## 2020-10-12 ENCOUNTER — Ambulatory Visit: Payer: BC Managed Care – PPO | Admitting: Cardiology

## 2020-10-31 ENCOUNTER — Ambulatory Visit: Payer: BC Managed Care – PPO | Admitting: Student

## 2020-11-09 ENCOUNTER — Other Ambulatory Visit: Payer: Self-pay | Admitting: Cardiology

## 2020-12-27 ENCOUNTER — Ambulatory Visit: Payer: BC Managed Care – PPO | Admitting: Student

## 2020-12-29 IMAGING — DX DG CHEST 2V
2 series · 2 of 2 positions shown · non-contrast
Comparison: 07/22/2018

CLINICAL DATA: Cough and shortness of breath for 1 year

EXAM:
CHEST - 2 VIEW

[chest pa]
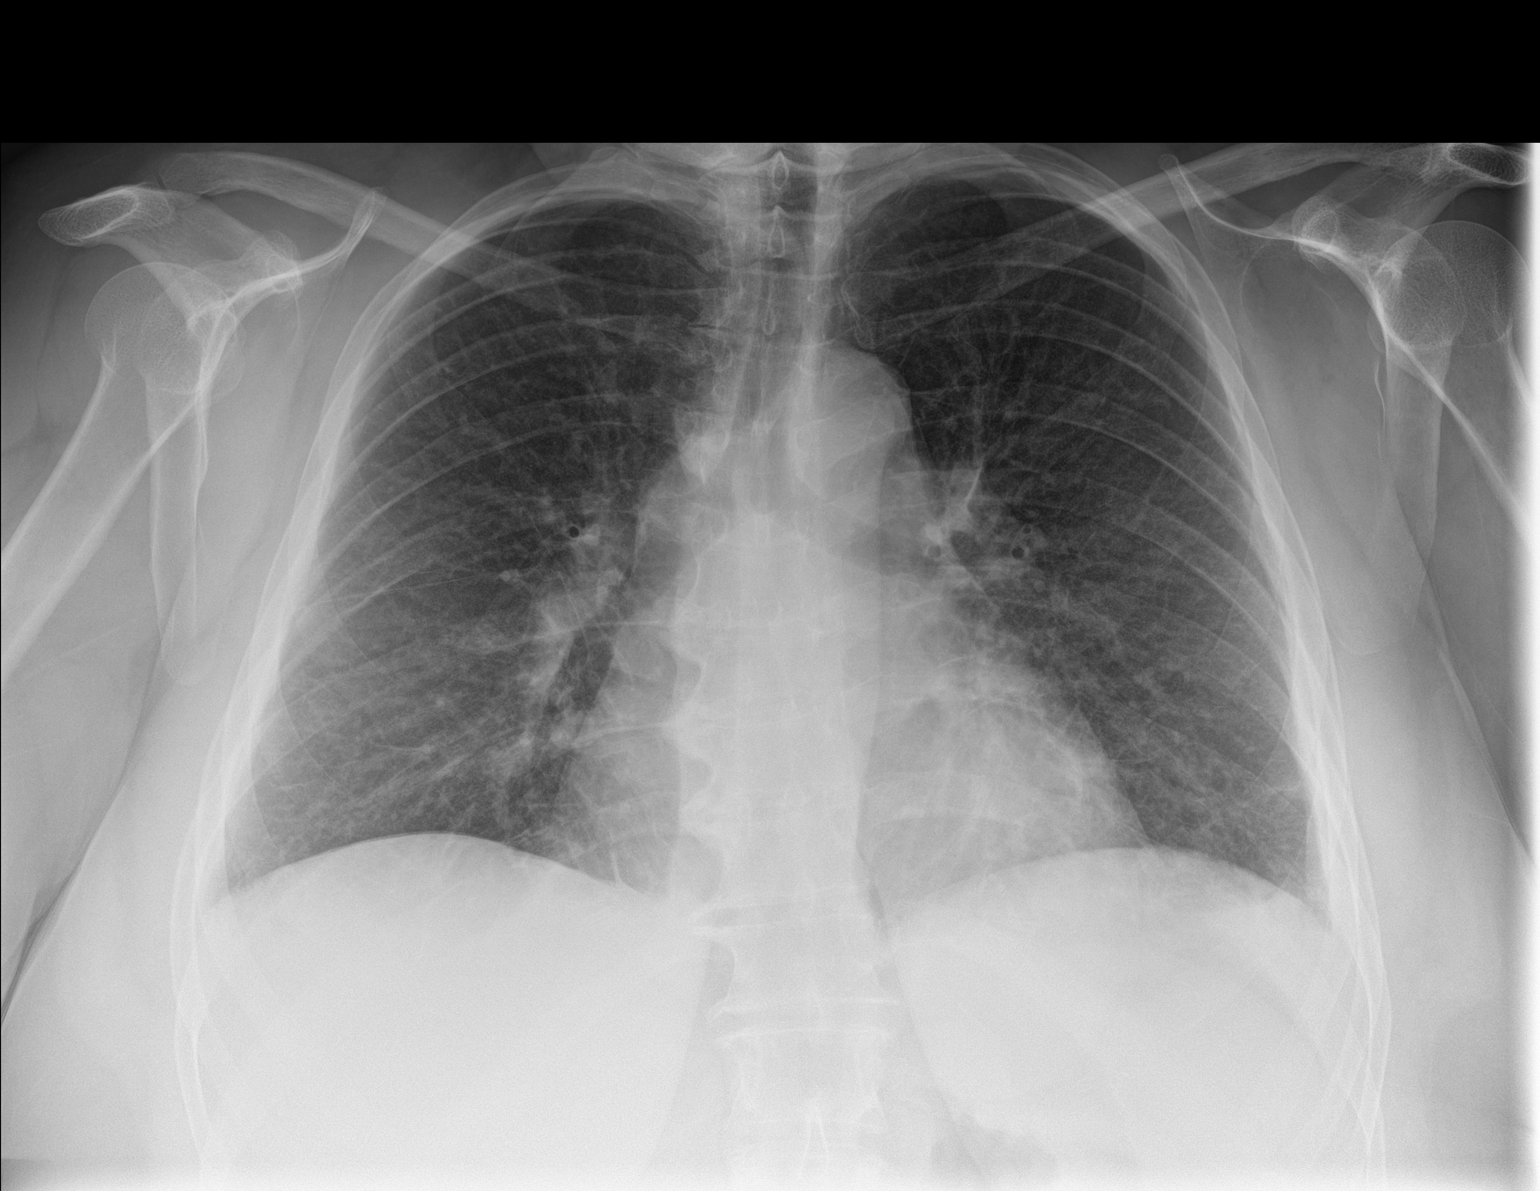

[chest lat]
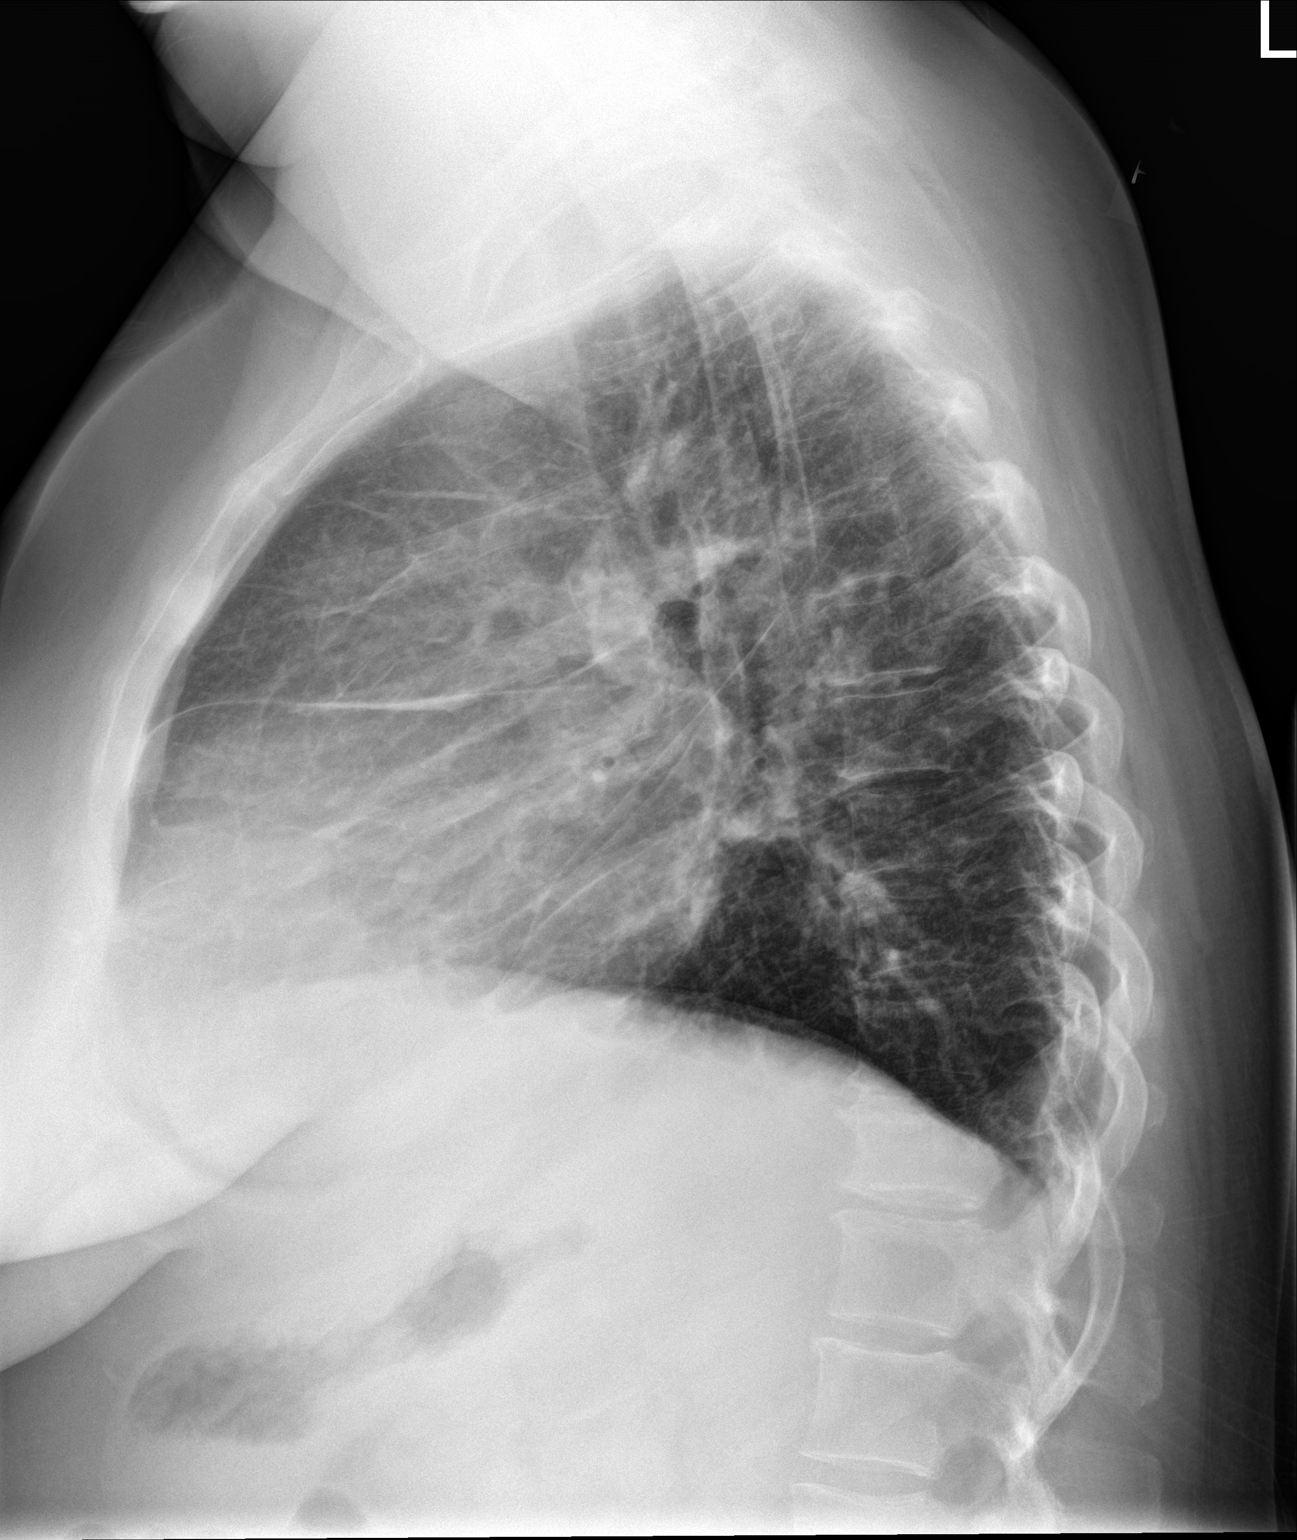

[2 of 2 positions shown; findings below may reference images not displayed]

FINDINGS: Cardiac shadow is enlarged. Mild central vascular congestion is
noted with minimal interstitial edema. No focal infiltrate or
sizable effusion is noted. No bony is noted.
IMPRESSION: Mild vascular congestion with interstitial edema.

## 2021-01-01 ENCOUNTER — Ambulatory Visit: Payer: BC Managed Care – PPO | Admitting: Cardiology

## 2021-01-24 DIAGNOSIS — Z20822 Contact with and (suspected) exposure to covid-19: Secondary | ICD-10-CM | POA: Diagnosis not present

## 2021-01-24 DIAGNOSIS — R197 Diarrhea, unspecified: Secondary | ICD-10-CM | POA: Diagnosis not present

## 2021-01-24 DIAGNOSIS — R051 Acute cough: Secondary | ICD-10-CM | POA: Diagnosis not present

## 2021-01-25 DIAGNOSIS — M79671 Pain in right foot: Secondary | ICD-10-CM | POA: Diagnosis not present

## 2021-01-25 DIAGNOSIS — Z6841 Body Mass Index (BMI) 40.0 and over, adult: Secondary | ICD-10-CM | POA: Diagnosis not present

## 2021-01-25 DIAGNOSIS — R7303 Prediabetes: Secondary | ICD-10-CM | POA: Diagnosis not present

## 2021-02-07 DIAGNOSIS — I5033 Acute on chronic diastolic (congestive) heart failure: Secondary | ICD-10-CM | POA: Diagnosis not present

## 2021-02-07 DIAGNOSIS — E782 Mixed hyperlipidemia: Secondary | ICD-10-CM | POA: Diagnosis not present

## 2021-02-07 DIAGNOSIS — Z6841 Body Mass Index (BMI) 40.0 and over, adult: Secondary | ICD-10-CM | POA: Diagnosis not present

## 2021-02-07 DIAGNOSIS — E1169 Type 2 diabetes mellitus with other specified complication: Secondary | ICD-10-CM | POA: Diagnosis not present

## 2021-02-07 DIAGNOSIS — M79671 Pain in right foot: Secondary | ICD-10-CM | POA: Diagnosis not present

## 2021-02-07 DIAGNOSIS — R7301 Impaired fasting glucose: Secondary | ICD-10-CM | POA: Diagnosis not present

## 2021-02-07 DIAGNOSIS — R7303 Prediabetes: Secondary | ICD-10-CM | POA: Diagnosis not present

## 2021-02-12 DIAGNOSIS — I5033 Acute on chronic diastolic (congestive) heart failure: Secondary | ICD-10-CM | POA: Diagnosis not present

## 2021-02-12 DIAGNOSIS — R945 Abnormal results of liver function studies: Secondary | ICD-10-CM | POA: Diagnosis not present

## 2021-02-12 DIAGNOSIS — E782 Mixed hyperlipidemia: Secondary | ICD-10-CM | POA: Diagnosis not present

## 2021-02-12 DIAGNOSIS — E1169 Type 2 diabetes mellitus with other specified complication: Secondary | ICD-10-CM | POA: Diagnosis not present

## 2021-02-15 ENCOUNTER — Ambulatory Visit: Payer: BC Managed Care – PPO | Admitting: Student

## 2021-03-07 ENCOUNTER — Other Ambulatory Visit: Payer: Self-pay | Admitting: Cardiology

## 2021-03-15 ENCOUNTER — Other Ambulatory Visit: Payer: Self-pay

## 2021-03-15 ENCOUNTER — Ambulatory Visit: Payer: BC Managed Care – PPO | Admitting: Student

## 2021-03-15 ENCOUNTER — Encounter: Payer: Self-pay | Admitting: Student

## 2021-03-15 VITALS — BP 128/78 | HR 96 | Ht 63.0 in | Wt 258.9 lb

## 2021-03-15 DIAGNOSIS — I493 Ventricular premature depolarization: Secondary | ICD-10-CM | POA: Diagnosis not present

## 2021-03-15 DIAGNOSIS — I471 Supraventricular tachycardia: Secondary | ICD-10-CM | POA: Diagnosis not present

## 2021-03-15 DIAGNOSIS — I5022 Chronic systolic (congestive) heart failure: Secondary | ICD-10-CM | POA: Diagnosis not present

## 2021-03-15 MED ORDER — SPIRONOLACTONE 25 MG PO TABS: 12.5000 mg | ORAL_TABLET | Freq: Every day | ORAL | 3 refills | Status: DC

## 2021-03-15 MED ORDER — CARVEDILOL 6.25 MG PO TABS: ORAL_TABLET | ORAL | 3 refills | Status: DC

## 2021-03-15 NOTE — Patient Instructions (Addendum)
Medication Instructions:  Your physician recommends that you continue on your current medications as directed. Please refer to the Current Medication list given to you today.  *If you need a refill on your cardiac medications before your next appointment, please call your pharmacy*   Lab Work: None If you have labs (blood work) drawn today and your tests are completely normal, you will receive your results only by: Marland Kitchen MyChart Message (if you have MyChart) OR . A paper copy in the mail If you have any lab test that is abnormal or we need to change your treatment, we will call you to review the results.   Testing/Procedures: Your physician has requested that you have an echocardiogram. Echocardiography is a painless test that uses sound waves to create images of your heart. It provides your doctor with information about the size and shape of your heart and how well your heart's chambers and valves are working. This procedure takes approximately one hour. There are no restrictions for this procedure.     Follow-Up: At [facility], you and your health needs are our priority.  As part of our continuing mission to provide you with exceptional heart care, we have created designated Provider Care Teams.  These Care Teams include your primary Cardiologist (physician) and Advanced Practice Providers (APPs -  Physician Assistants and Nurse Practitioners) who all work together to provide you with the care you need, when you need it.  We recommend signing up for the patient portal called "MyChart".  Sign up information is provided on this After Visit Summary.  MyChart is used to connect with patients for Virtual Visits (Telemedicine).  Patients are able to view lab/test results, encounter notes, upcoming appointments, etc.  Non-urgent messages can be sent to your provider as well.   To learn more about what you can do with MyChart, go to [email or web address].    Your next appointment:   6  month(s)  The format for your next appointment:   In Person  Provider:   You may see Carlyle Dolly, MD or one of the following Advanced Practice Providers on your designated Care Team:    Bernerd Pho, PA-C     Other Instructions   Echocardiogram An echocardiogram is a test that uses sound waves (ultrasound) to produce images of the heart. Images from an echocardiogram can provide important information about:  Heart size and shape.  The size and thickness and movement of your heart's walls.  Heart muscle function and strength.  Heart valve function or if you have stenosis. Stenosis is when the heart valves are too narrow.  If blood is flowing backward through the heart valves (regurgitation).  A tumor or infectious growth around the heart valves.  Areas of heart muscle that are not working well because of poor blood flow or injury from a heart attack.  Aneurysm detection. An aneurysm is a weak or damaged part of an artery wall. The wall bulges out from the normal force of blood pumping through the body. Tell a health care provider about:  Any allergies you have.  All medicines you are taking, including vitamins, herbs, eye drops, creams, and over-the-counter medicines.  Any blood disorders you have.  Any surgeries you have had.  Any medical conditions you have.  Whether you are pregnant or may be pregnant. What are the risks? Generally, this is a safe test. However, problems may occur, including an allergic reaction to dye (contrast) that may be used during the test. What happens before  the test? No specific preparation is needed. You may eat and drink normally. What happens during the test?  You will take off your clothes from the waist up and put on a hospital gown.  Electrodes or electrocardiogram (ECG)patches may be placed on your chest. The electrodes or patches are then connected to a device that monitors your heart rate and rhythm.  You will lie  down on a table for an ultrasound exam. A gel will be applied to your chest to help sound waves pass through your skin.  A handheld device, called a transducer, will be pressed against your chest and moved over your heart. The transducer produces sound waves that travel to your heart and bounce back (or "echo" back) to the transducer. These sound waves will be captured in real-time and changed into images of your heart that can be viewed on a video monitor. The images will be recorded on a computer and reviewed by your health care provider.  You may be asked to change positions or hold your breath for a short time. This makes it easier to get different views or better views of your heart.  In some cases, you may receive contrast through an IV in one of your veins. This can improve the quality of the pictures from your heart. The procedure may vary among health care providers and hospitals.   What can I expect after the test? You may return to your normal, everyday life, including diet, activities, and medicines, unless your health care provider tells you not to do that. Follow these instructions at home:  It is up to you to get the results of your test. Ask your health care provider, or the department that is doing the test, when your results will be ready.  Keep all follow-up visits. This is important. Summary  An echocardiogram is a test that uses sound waves (ultrasound) to produce images of the heart.  Images from an echocardiogram can provide important information about the size and shape of your heart, heart muscle function, heart valve function, and other possible heart problems.  You do not need to do anything to prepare before this test. You may eat and drink normally.  After the echocardiogram is completed, you may return to your normal, everyday life, unless your health care provider tells you not to do that. This information is not intended to replace advice given to you by your  health care provider. Make sure you discuss any questions you have with your health care provider. Document Revised: 08/07/2020 Document Reviewed: 08/07/2020 Elsevier Patient Education  2021 [employer].

## 2021-03-15 NOTE — Progress Notes (Signed)
Cardiology Office Note    Date:  03/15/2021   ID:  Margaret [name], DOB [birth year], MRN [id number]  PCP:  Celene Squibb, MD  Cardiologist: Carlyle Dolly, MD    Chief Complaint  Patient presents with  . Follow-up    No recent symptoms    History of Present Illness:    Margaret [name] is a 45 y.o. female with past medical history of chronic systolic CHF/NICM (EF 16-94% by echo in 12/2018 with cath showing normal cors and no evidence of myocarditis or infiltrating disease by cardiac MRI, EF at 45-50% by echo in 02/2020), PVC's and SVT (s/p ablation in 12/2015) who presents to the office today for overdue 23-monthfollow-up.  She most recently had a telehealth visit with Dr. BHarl Bowiein 03/2020 and denied any recent chest pain or dyspnea at that time. Coreg had previously been titrated but she experienced stiffness and itching with the medication, therefore she was back on 6.25 mg in AM/12.520min PM. She previously had lip swelling with Entresto but symptoms had resolved, therefore she was rechallenged with Losartan 12.5 mg daily. She called the office 2 months later reporting lip swelling with Losartan, therefore it was discontinued and added to her allergy list.  In talking with the patient today, she reports overall doing well since her last visit. She does not exercise regularly but remains physically active at her job at [employer] denies any recent exertional chest pain or dyspnea on exertion. She does report an occasional aching sensation along her breast but this spontaneously resolves within a few seconds and is also relieved with massage. She denies any recent orthopnea, PND, lower extremity edema or palpitations. She has significantly reduced her caffeine intake over the years and only consumes a glass of tea on occasion. No alcohol use.   Past Medical History:  Diagnosis Date  . NICM ([place]mic cardiomyopathy) ([facility]   a. EF 15-20% by echo in 12/2018 with cath showing normal cors  and no evidence of myocarditis or infiltrating disease by cardiac MRI b. EF at 45-50% by echo in 02/2020)  . SVT (supraventricular tachycardia) ([facility])     Past Surgical History:  Procedure Laterality Date  . APPENDECTOMY  1981  . CESAREAN SECTION  1999; 2007  . ELECTROPHYSIOLOGIC STUDY N/A 01/09/2016   Procedure: SVT Ablation;  Surgeon: GrEvans LanceMD;  [facility];  Service: Cardiovascular;  Laterality: N/A;  . LAPAROSCOPIC CHOLECYSTECTOMY  1998  . MICROLARYNGOSCOPY Left 04/26/2018   Procedure: MICROLARYNGOSCOPY WITH EXCISION OF VALLECULER MASS;  Surgeon: TeLeta BaptistMD;  [facility] Service: ENT;  Laterality: Left;  . RIGHT/LEFT HEART CATH AND CORONARY ANGIOGRAPHY N/A 02/09/2019   Procedure: RIGHT/LEFT HEART CATH AND CORONARY ANGIOGRAPHY;  Surgeon: JoMartiniquePeter M, MD;  [facility];  Service: Cardiovascular;  Laterality: N/A;  . SUPRAVENTRICULAR TACHYCARDIA ABLATION  01/09/2016  . TUBAL LIGATION  2007    Current Medications: Outpatient Medications Prior to Visit  Medication Sig Dispense Refill  . atorvastatin (LIPITOR) 10 MG tablet TAKE 1 TABLET BY MOUTH AT NIGHT ON MONDAY, WEDNESDAY AND FRIDAY FOR CHOLESTEROL    . Blood Glucose Monitoring Suppl (CONTOUR NEXT MONITOR) w/Device KIT USE TO CHECK BLOOD SUGAR PER PACKAGE INSTRUCTIONS    . CONTOUR NEXT TEST test strip 2 (two) times daily.    . furosemide (LASIX) 40 MG tablet TAKE 40 MG DAILY BY MOUTH.  MAY TAKE AN ADDITIONAL 40 MG DAILY AS  NEEDED FOR SHORTNESS OF BREATH OR SWELLING. 180 tablet 3  . Microlet Lancets MISC USE TO CHECK BLOOD SUGAR TWICE A DAY    . potassium chloride SA (KLOR-CON) 20 MEQ tablet TAKE ONE TABLET (20MEQ TOTAL) BY MOUTH DAILY 90 tablet 3  . sitaGLIPtin-metformin (JANUMET) 50-500 MG tablet Take 1 tablet by mouth daily.    . carvedilol (COREG) 6.25 MG tablet TAKE ONE TABLET (6.25MG) EVERY MORNING AND TAKE TWO TABLETS (12.5MG) EVERY EVENING 270 tablet 0  .  potassium chloride (K-DUR) 10 MEQ tablet Take 1 tablet by mouth daily.    Marland Kitchen spironolactone (ALDACTONE) 25 MG tablet TAKE ONE-HALF TABLET (12.5MG TOTAL) BY MOUTH DAILY 45 tablet 0  . acetaminophen (TYLENOL) 500 MG tablet Take 1,000 mg by mouth every 6 (six) hours as needed for mild pain or headache.    . predniSONE (DELTASONE) 50 MG tablet 1 tablet PO daily 5 tablet 0   No facility-administered medications prior to visit.     Allergies:   Entresto [sacubitril-valsartan], Codeine, Losartan, and Sulfa antibiotics   Social History   Socioeconomic History  . Marital status: Married    Spouse name: Not on file  . Number of children: Not on file  . Years of education: Not on file  . Highest education level: Not on file  Occupational History  . Not on file  Tobacco Use  . Smoking status: Never Smoker  . Smokeless tobacco: Never Used  Vaping Use  . Vaping Use: Never used  Substance and Sexual Activity  . Alcohol use: No  . Drug use: No  . Sexual activity: Yes    Birth control/protection: Surgical    Comment: BTL  Other Topics Concern  . Not on file  Social History Narrative  . Not on file   Social Determinants of Health   Financial Resource Strain: Not on file  Food Insecurity: Not on file  Transportation Needs: Not on file  Physical Activity: Not on file  Stress: Not on file  Social Connections: Not on file     Family History:  The patient's family history includes Heart failure in her father; Hypertension in her father and mother; Stroke in her father.   Review of Systems:   Please see the history of present illness.     General:  No chills, fever, night sweats or weight changes.  Cardiovascular:  No exertional chest pain, dyspnea on exertion, edema, orthopnea, palpitations, paroxysmal nocturnal dyspnea. Dermatological: No rash, lesions/masses Respiratory: No cough, dyspnea Urologic: No hematuria, dysuria Abdominal:   No nausea, vomiting, diarrhea, bright red blood  per rectum, melena, or hematemesis Neurologic:  No visual changes, wkns, changes in mental status. All other systems reviewed and are otherwise negative except as noted above.   Physical Exam:    VS:  BP 128/78   Pulse 96   Ht _0  (1.6 m)   Wt 258 lb 14.4 oz (117.4 kg)   SpO2 100%   BMI 45.86 kg/m    General: Well developed, well nourished,female appearing in no acute distress. Head: Normocephalic, atraumatic. Neck: No carotid bruits. JVD not elevated.  Lungs: Respirations regular and unlabored, without wheezes or rales.  Heart: Regular rate and rhythm with occasional ectopic beats. No S3 or S4.  No murmur, no rubs, or gallops appreciated. Abdomen: Appears non-distended. No obvious abdominal masses. Msk:  Strength and tone appear normal for age. No obvious joint deformities or effusions. Extremities: No clubbing or cyanosis. No edema.  Distal pedal pulses are 2+ bilaterally.  Neuro: Alert and oriented X 3. Moves all extremities spontaneously. No focal deficits noted. Psych:  Responds to questions appropriately with a normal affect. Skin: No rashes or lesions noted  Wt Readings from Last 3 Encounters:  03/15/21 258 lb 14.4 oz (117.4 kg)  04/04/20 256 lb (116.1 kg)  02/28/20 246 lb (111.6 kg)     Studies/Labs Reviewed:   EKG:  EKG is ordered today. The EKG ordered today demonstrates NSR, HR 84 with PVC's. Slight ST depression along inferior leads similar to prior tracings.   Recent Labs: No results found for requested labs within last 8760 hours.   Lipid Panel No results found for: CHOL, TRIG, HDL, CHOLHDL, VLDL, LDLCALC, LDLDIRECT  Additional studies/ records that were reviewed today include:   Cardiac Catheterization: 01/2019  There is severe left ventricular systolic dysfunction.  LV end diastolic pressure is normal.  The left ventricular ejection fraction is less than 25% by visual estimate.   1. Normal coronary anatomy. Left dominant circulation 2. Severe  global LV dysfunction. EF estimated at 15-20%.  3. Normal LV filling pressures 4. Normal right heart pressures 5. Preserved cardiac output. Index 2.85 L/min/m squared.   Echocardiogram: 02/2020 IMPRESSIONS    1. Left ventricular ejection fraction, by estimation, is 45 to 50%. The  left ventricle has mildly decreased function. The left ventricle has no  regional wall motion abnormalities. Left ventricular diastolic parameters  were normal.  2. Right ventricular systolic function is normal. The right ventricular  size is normal. There is normal pulmonary artery systolic pressure.  3. The mitral valve is normal in structure and function. Trivial mitral  valve regurgitation. No evidence of mitral stenosis.  4. The aortic valve has an indeterminant number of cusps. Aortic valve  regurgitation is mild. No aortic stenosis is present.  5. The inferior vena cava is normal in size with greater than 50%  respiratory variability, suggesting right atrial pressure of 3 mmHg.   Assessment:    1. Chronic systolic heart failure ([place])   2. PSVT (paroxysmal supraventricular tachycardia) ([place])   3. PVC (premature ventricular contraction)      Plan:   In order of problems listed above:  1. Chronic Systolic CHF/NICM - EF was at 15-20% by echo in 12/2018 with cath showing normal cors and no evidence of myocarditis or infiltrating disease by cardiac MRI. Most recent echocardiogram in 02/2020 showed her EF had improved to 45-50%.  - She denies any recent orthopnea, PND or lower extremity edema. Appears euvolemic by examination. Given her PVC's by EKG today, I did recommend that we obtain a repeat echocardiogram to make sure her EF remains stable. She remains on Coreg 6.71m in AM/12.527min PM and Spironolactone 12.33m67maily. If her EF has declined since prior imaging, would rechallenge with dose increase of Coreg to 12.5 mg twice daily given her ectopy. She is not on an ACE-I, ARB or ARNI given  concerns for angioedema in the past with Losartan and Entresto.   2. SVT - She is s/p prior ablation in 12/2015 by Dr. TayLovena Leenies any recent palpitations. Continue Coreg 6.233m533m AM/12.33mg 49mPM.   3. PVC's - She has a history of PVC's with prior monitor in 03/2019 showing a 5.6% PVC burden. Will plan to obtain a repeat echocardiogram as outlined above. Continue Coreg at current dosing for now. She has significantly reduced her caffeine intake and dose not consume alcohol.     Medication Adjustments/Labs and Tests Ordered: Current medicines are reviewed  at length with the patient today.  Concerns regarding medicines are outlined above.  Medication changes, Labs and Tests ordered today are listed in the Patient Instructions below. Patient Instructions   Medication Instructions:  Your physician recommends that you continue on your current medications as directed. Please refer to the Current Medication list given to you today.  *If you need a refill on your cardiac medications before your next appointment, please call your pharmacy*   Lab Work: None If you have labs (blood work) drawn today and your tests are completely normal, you will receive your results only by: Marland Kitchen MyChart Message (if you have MyChart) OR . A paper copy in the mail If you have any lab test that is abnormal or we need to change your treatment, we will call you to review the results.   Testing/Procedures: Your physician has requested that you have an echocardiogram. Echocardiography is a painless test that uses sound waves to create images of your heart. It provides your doctor with information about the size and shape of your heart and how well your heart's chambers and valves are working. This procedure takes approximately one hour. There are no restrictions for this procedure.     Follow-Up: At [facility], you and your health needs are our priority.  As part of our continuing mission to provide you with  exceptional heart care, we have created designated Provider Care Teams.  These Care Teams include your primary Cardiologist (physician) and Advanced Practice Providers (APPs -  Physician Assistants and Nurse Practitioners) who all work together to provide you with the care you need, when you need it.  We recommend signing up for the patient portal called "MyChart".  Sign up information is provided on this After Visit Summary.  MyChart is used to connect with patients for Virtual Visits (Telemedicine).  Patients are able to view lab/test results, encounter notes, upcoming appointments, etc.  Non-urgent messages can be sent to your provider as well.   To learn more about what you can do with MyChart, go to [email or web address].    Your next appointment:   6 month(s)  The format for your next appointment:   In Person  Provider:   You may see Carlyle Dolly, MD or one of the following Advanced Practice Providers on your designated Care Team:    Bernerd Pho, PA-C     Other Instructions   Echocardiogram An echocardiogram is a test that uses sound waves (ultrasound) to produce images of the heart. Images from an echocardiogram can provide important information about:  Heart size and shape.  The size and thickness and movement of your heart's walls.  Heart muscle function and strength.  Heart valve function or if you have stenosis. Stenosis is when the heart valves are too narrow.  If blood is flowing backward through the heart valves (regurgitation).  A tumor or infectious growth around the heart valves.  Areas of heart muscle that are not working well because of poor blood flow or injury from a heart attack.  Aneurysm detection. An aneurysm is a weak or damaged part of an artery wall. The wall bulges out from the normal force of blood pumping through the body. Tell a health care provider about:  Any allergies you have.  All medicines you are taking, including vitamins,  herbs, eye drops, creams, and over-the-counter medicines.  Any blood disorders you have.  Any surgeries you have had.  Any medical conditions you have.  Whether you are pregnant or may  be pregnant. What are the risks? Generally, this is a safe test. However, problems may occur, including an allergic reaction to dye (contrast) that may be used during the test. What happens before the test? No specific preparation is needed. You may eat and drink normally. What happens during the test?  You will take off your clothes from the waist up and put on a hospital gown.  Electrodes or electrocardiogram (ECG)patches may be placed on your chest. The electrodes or patches are then connected to a device that monitors your heart rate and rhythm.  You will lie down on a table for an ultrasound exam. A gel will be applied to your chest to help sound waves pass through your skin.  A handheld device, called a transducer, will be pressed against your chest and moved over your heart. The transducer produces sound waves that travel to your heart and bounce back (or "echo" back) to the transducer. These sound waves will be captured in real-time and changed into images of your heart that can be viewed on a video monitor. The images will be recorded on a computer and reviewed by your health care provider.  You may be asked to change positions or hold your breath for a short time. This makes it easier to get different views or better views of your heart.  In some cases, you may receive contrast through an IV in one of your veins. This can improve the quality of the pictures from your heart. The procedure may vary among health care providers and hospitals.   What can I expect after the test? You may return to your normal, everyday life, including diet, activities, and medicines, unless your health care provider tells you not to do that. Follow these instructions at home:  It is up to you to get the results of  your test. Ask your health care provider, or the department that is doing the test, when your results will be ready.  Keep all follow-up visits. This is important. Summary  An echocardiogram is a test that uses sound waves (ultrasound) to produce images of the heart.  Images from an echocardiogram can provide important information about the size and shape of your heart, heart muscle function, heart valve function, and other possible heart problems.  You do not need to do anything to prepare before this test. You may eat and drink normally.  After the echocardiogram is completed, you may return to your normal, everyday life, unless your health care provider tells you not to do that. This information is not intended to replace advice given to you by your health care provider. Make sure you discuss any questions you have with your health care provider. Document Revised: 08/07/2020 Document Reviewed: 08/07/2020 Elsevier Patient Education  [address]         Signed, Erma Heritage, [place]  03/15/2021 4:53 PM    [place] [address] [place], [place] [address] Phone: [phone] Fax: [phone]

## 2021-03-20 DIAGNOSIS — Z1151 Encounter for screening for human papillomavirus (HPV): Secondary | ICD-10-CM | POA: Diagnosis not present

## 2021-03-20 DIAGNOSIS — Z01419 Encounter for gynecological examination (general) (routine) without abnormal findings: Secondary | ICD-10-CM | POA: Diagnosis not present

## 2021-03-20 DIAGNOSIS — Z6841 Body Mass Index (BMI) 40.0 and over, adult: Secondary | ICD-10-CM | POA: Diagnosis not present

## 2021-04-04 ENCOUNTER — Other Ambulatory Visit: Payer: Self-pay

## 2021-04-04 ENCOUNTER — Ambulatory Visit (HOSPITAL_COMMUNITY)
Admission: RE | Admit: 2021-04-04 | Discharge: 2021-04-04 | Disposition: A | Discharge status: Home / Self Care | Payer: BC Managed Care – PPO | Source: Ambulatory Visit | Attending: Student | Admitting: Student

## 2021-04-04 DIAGNOSIS — I5022 Chronic systolic (congestive) heart failure: Secondary | ICD-10-CM | POA: Diagnosis not present

## 2021-04-04 LAB — ECHOCARDIOGRAM COMPLETE
AR max vel: 1.41 cm2
AV Area VTI: 1.61 cm2
AV Area mean vel: 1.35 cm2
AV Mean grad: 6 mmHg
AV Peak grad: 9.9 mmHg
Ao pk vel: 1.57 m/s
Area-P 1/2: 3.42 cm2
Calc EF: 45.4 %
S' Lateral: 3.7 cm
Single Plane A2C EF: 41.8 %
Single Plane A4C EF: 45.7 %

## 2021-04-04 NOTE — Progress Notes (Signed)
*  PRELIMINARY RESULTS* Echocardiogram 2D Echocardiogram has been performed.  Margaret [name] 04/04/2021, 1:50 PM

## 2021-05-02 DIAGNOSIS — Z1231 Encounter for screening mammogram for malignant neoplasm of breast: Secondary | ICD-10-CM | POA: Diagnosis not present

## 2021-06-12 ENCOUNTER — Telehealth: Payer: Self-pay | Admitting: Cardiology

## 2021-06-12 MED ORDER — FUROSEMIDE 40 MG PO TABS: ORAL_TABLET | ORAL | 3 refills | Status: DC

## 2021-06-12 MED ORDER — CARVEDILOL 6.25 MG PO TABS: ORAL_TABLET | ORAL | 3 refills | Status: DC

## 2021-06-12 MED ORDER — SPIRONOLACTONE 25 MG PO TABS: 12.5000 mg | ORAL_TABLET | Freq: Every day | ORAL | 3 refills | Status: DC

## 2021-06-12 MED ORDER — POTASSIUM CHLORIDE CRYS ER 20 MEQ PO TBCR: EXTENDED_RELEASE_TABLET | ORAL | 3 refills | Status: DC

## 2021-06-12 NOTE — Telephone Encounter (Signed)
New message   Pharmacy switch    *STAT* If patient is at the pharmacy, call can be transferred to refill team.   1. Which medications need to be refilled? (please list name of each medication and dose if known)  send all her prescriptions to new pharmacy   2. Which pharmacy/location (including street and city if local pharmacy) is medication to be sent to? [facility]  3. Do they need a 30 day or 90 day supply? Margaret [name]

## 2021-06-12 NOTE — Telephone Encounter (Signed)
Error

## 2021-06-12 NOTE — Telephone Encounter (Signed)
Pharmacy changed to [facility], refills e-scribed

## 2021-08-12 DIAGNOSIS — E782 Mixed hyperlipidemia: Secondary | ICD-10-CM | POA: Diagnosis not present

## 2021-08-21 DIAGNOSIS — E041 Nontoxic single thyroid nodule: Secondary | ICD-10-CM | POA: Diagnosis not present

## 2021-08-21 DIAGNOSIS — R000 Tachycardia, unspecified: Secondary | ICD-10-CM | POA: Diagnosis not present

## 2021-08-21 DIAGNOSIS — I5033 Acute on chronic diastolic (congestive) heart failure: Secondary | ICD-10-CM | POA: Diagnosis not present

## 2021-08-21 DIAGNOSIS — R945 Abnormal results of liver function studies: Secondary | ICD-10-CM | POA: Diagnosis not present

## 2021-09-13 ENCOUNTER — Ambulatory Visit: Payer: BC Managed Care – PPO | Admitting: Student

## 2021-09-13 ENCOUNTER — Encounter: Payer: Self-pay | Admitting: Student

## 2021-09-13 ENCOUNTER — Other Ambulatory Visit: Payer: Self-pay

## 2021-09-13 VITALS — BP 116/70 | HR 80 | Ht 63.0 in | Wt 257.0 lb

## 2021-09-13 DIAGNOSIS — I493 Ventricular premature depolarization: Secondary | ICD-10-CM

## 2021-09-13 DIAGNOSIS — I428 Other cardiomyopathies: Secondary | ICD-10-CM

## 2021-09-13 DIAGNOSIS — Z8679 Personal history of other diseases of the circulatory system: Secondary | ICD-10-CM | POA: Diagnosis not present

## 2021-09-13 DIAGNOSIS — I471 Supraventricular tachycardia: Secondary | ICD-10-CM | POA: Diagnosis not present

## 2021-09-13 DIAGNOSIS — B3731 Acute candidiasis of vulva and vagina: Secondary | ICD-10-CM

## 2021-09-13 DIAGNOSIS — B373 Candidiasis of vulva and vagina: Secondary | ICD-10-CM

## 2021-09-13 MED ORDER — FLUCONAZOLE 150 MG PO TABS: 150.0000 mg | ORAL_TABLET | Freq: Once | ORAL | 2 refills | Status: AC

## 2021-09-13 NOTE — Progress Notes (Signed)
Cardiology Office Note    Date:  09/13/2021   ID:  Margaret [name], DOB 03-Apr-1976, MRN [id number]  PCP:  Celene Squibb, MD  Cardiologist: Carlyle Dolly, MD    Chief Complaint  Patient presents with   Follow-up    6 month visit    History of Present Illness:    Margaret [name] is a 45 y.o. female with past medical history of HFimpEF/NICM (EF 15-20% by echo in 12/2018 with cath showing normal cors and no evidence of myocarditis or infiltrating disease by cardiac MRI, EF at 45-50% by echo in 02/2020), PVC's and SVT (s/p ablation in 12/2015) who presents to the office today for 45-monthfollow-up.  She was last examined by myself in 02/2021 and denied any recent exertional chest pain or dyspnea on exertion. She did have frequent PVC's on her EKG and it was recommended that we obtain a repeat echocardiogram for reassessment of her EF. She was continued on Coreg and Spironolactone her current dosing as she had previously been intolerant to higher doses of Coreg in the past and had not been on an ACE-I/ARB/ARNi given concerns for angioedema in the past with Losartan and Entresto. Echocardiogram showed her EF remained stable at 50% with grade 1 diastolic dysfunction and mild AI.  In talking with the patient today, she reports being under increased stress over the past several weeks as her husband underwent coronary bypass surgery earlier this month. He is at home recovering well but will likely not return to work for 3 months as he works in [employer] production [employer] She denies any recent chest pain or progressive dyspnea. She does experience occasional palpitations but no persistent symptoms. No recent orthopnea, PND or pitting edema.  She was recently started on Farxiga by her PCP and has been using OTC medications for a yeast infection this week.   Past Medical History:  Diagnosis Date   NIC[place] ([place]ischemic cardiomyopathy) ([place]    a. EF 15-20% by echo in 12/2018 with cath showing normal cors and no evidence  of myocarditis or infiltrating disease by cardiac MRI b. EF at 45-50% by echo in 02/2020)   SVT (supraventricular tachycardia) ([facility]     Past Surgical History:  Procedure Laterality Date   [place] 2007   ELECTROPHYSIOLOGIC STUDY N/A 01/09/2016   Procedure: SVT Ablation;  Surgeon: GEvans Lance MD;  [facility];  Service: Cardiovascular;  Laterality: N/A;   LAPAROSCOPIC CHOLECYSTECTOMY  1998   MICROLARYNGOSCOPY Left 04/26/2018   Procedure: MICROLARYNGOSCOPY WITH EXCISION OF VALLECULER MASS;  Surgeon: TLeta Baptist MD;  [place] [facility]  Service: ENT;  Laterality: Left;   RIGHT/LEFT HEART CATH AND CORONARY ANGIOGRAPHY N/A 02/09/2019   Procedure: RIGHT/LEFT HEART CATH AND CORONARY ANGIOGRAPHY;  Surgeon: JMartinique Peter M, MD;  [facility];  Service: Cardiovascular;  Laterality: N/A;   SUPRAVENTRICULAR TACHYCARDIA ABLATION  01/09/2016   TUBAL LIGATION  2007    Current Medications: Outpatient Medications Prior to Visit  Medication Sig Dispense Refill   atorvastatin (LIPITOR) 10 MG tablet TAKE 1 TABLET BY MOUTH AT NIGHT ON MONDAY, WEDNESDAY AND FRIDAY FOR CHOLESTEROL     Blood Glucose Monitoring Suppl (CONTOUR NEXT MONITOR) w/Device KIT USE TO CHECK BLOOD SUGAR PER PACKAGE INSTRUCTIONS     carvedilol (COREG) 6.25 MG tablet TAKE ONE TABLET (6.25MG) EVERY MORNING AND TAKE TWO TABLETS (12.5MG) EVERY EVENING 270 tablet 3   CONTOUR NEXT TEST test strip  2 (two) times daily.     FARXIGA 10 MG TABS tablet Take 10 mg by mouth daily.     furosemide (LASIX) 40 MG tablet TAKE 40 MG DAILY BY MOUTH.  MAY TAKE AN ADDITIONAL 40 MG DAILY AS NEEDED FOR SHORTNESS OF BREATH OR SWELLING. 180 tablet 3   Microlet Lancets MISC USE TO CHECK BLOOD SUGAR TWICE A DAY     potassium chloride SA (KLOR-CON) 20 MEQ tablet TAKE ONE TABLET (20MEQ TOTAL) BY MOUTH DAILY 90 tablet 3   spironolactone (ALDACTONE) 25 MG tablet Take 0.5 tablets (12.5  mg total) by mouth daily. 45 tablet 3   sitaGLIPtin-metformin (JANUMET) 50-500 MG tablet Take 1 tablet by mouth daily. (Patient not taking: Reported on 09/13/2021)     No facility-administered medications prior to visit.     Allergies:   Entresto [sacubitril-valsartan], Codeine, Losartan, and Sulfa antibiotics   Social History   Socioeconomic History   Marital status: Married    Spouse name: Not on file   Number of children: Not on file   Years of education: Not on file   Highest education level: Not on file  Occupational History   Not on file  Tobacco Use   Smoking status: Never   Smokeless tobacco: Never  Vaping Use   Vaping Use: Never used  Substance and Sexual Activity   Alcohol use: No   Drug use: No   Sexual activity: Yes    Birth control/protection: Surgical    Comment: BTL  Other Topics Concern   Not on file  Social History Narrative   Not on file   Social Determinants of Health   Financial Resource Strain: Not on file  Food Insecurity: Not on file  Transportation Needs: Not on file  Physical Activity: Not on file  Stress: Not on file  Social Connections: Not on file     Family History:  The patient's family history includes Heart failure in her father; Hypertension in her father and mother; Stroke in her father.   Review of Systems:    Please see the history of present illness.     All other systems reviewed and are otherwise negative except as noted above.   Physical Exam:    VS:  BP 116/70   Pulse 80   Ht _0  (1.6 m)   Wt 257 lb (116.6 kg)   SpO2 97%   BMI 45.53 kg/m    General: Well developed, well nourished,female appearing in no acute distress. Head: Normocephalic, atraumatic. Neck: No carotid bruits. JVD not elevated.  Lungs: Respirations regular and unlabored, without wheezes or rales.  Heart: Regular rate and rhythm. No S3 or S4.  No murmur, no rubs, or gallops appreciated. Abdomen: Appears non-distended. No obvious abdominal  masses. Msk:  Strength and tone appear normal for age. No obvious joint deformities or effusions. Extremities: No clubbing or cyanosis. No pitting edema.  Distal pedal pulses are 2+ bilaterally. Neuro: Alert and oriented X 3. Moves all extremities spontaneously. No focal deficits noted. Psych:  Responds to questions appropriately with a normal affect. Skin: No rashes or lesions noted  Wt Readings from Last 3 Encounters:  09/13/21 257 lb (116.6 kg)  03/15/21 258 lb 14.4 oz (117.4 kg)  04/04/20 256 lb (116.1 kg)    Studies/Labs Reviewed:   EKG:  EKG is not ordered today.   Recent Labs: No results found for requested labs within last 8760 hours.   Lipid Panel No results found for: CHOL, TRIG, HDL, CHOLHDL,  VLDL, LDLCALC, LDLDIRECT  Additional studies/ records that were reviewed today include:   Cardiac Catheterization: 01/2019 There is severe left ventricular systolic dysfunction. LV end diastolic pressure is normal. The left ventricular ejection fraction is less than 25% by visual estimate.   1. Normal coronary anatomy. Left dominant circulation 2. Severe global LV dysfunction. EF estimated at 15-20%.  3. Normal LV filling pressures 4. Normal right heart pressures 5. Preserved cardiac output. Index 2.85 L/min/m squared.    Echocardiogram: 03/2021 IMPRESSIONS     1. Left ventricular ejection fraction, by estimation, is 50%. The left  ventricle has low normal function. Left ventricular endocardial border not  optimally defined to evaluate regional wall motion. Left ventricular  diastolic parameters are consistent  with Grade I diastolic dysfunction (impaired relaxation).   2. Right ventricular systolic function is normal. The right ventricular  size is normal. There is normal pulmonary artery systolic pressure.   3. The mitral valve is normal in structure. Trivial mitral valve  regurgitation. No evidence of mitral stenosis.   4. The aortic valve has an indeterminant  number of cusps. Aortic valve  regurgitation is mild. No aortic stenosis is present.   5. The inferior vena cava is normal in size with greater than 50%  respiratory variability, suggesting right atrial pressure of 3 mmHg.   Assessment:    1. NICM (nonischemic cardiomyopathy) ([place])   2. History of cardiomyopathy   3. PSVT (paroxysmal supraventricular tachycardia) ([facility])   4. PVC (premature ventricular contraction)   5. Vaginal yeast infection      Plan:   In order of problems listed above:  1. HFimpEF/NICM - Her EF was previously at 15-20% by echo in 12/2018 with cath showing normal cors and no evidence of myocarditis or infiltrating disease by cardiac MRI. Her EF had improved to 45-50% by echo in 02/2020 and remained stable by repeat echo in 03/2021. - She denies any recent orthopnea, PND or pitting edema and appears euvolemic by examination today. - Continue current medication regimen with Coreg, Farxiga and Spironolactone. She has not been on an ACE-I/ARB/ARNI given concerns of angioedema with Losartan and Entresto in the past.  2. SVT - She underwent prior ablation by Dr. Lovena Le in 12/2015. She does experience occasional symptoms but no persistent palpitations. Continue current dose of Coreg as she previously had side effects with higher dosing.  3. PVC's - She reports her palpitations have overall been well controlled on her current regimen.  Continue Coreg 6.25 mg in AM/12.5 mg in PM.  4. Yeast Infection - She has been experiencing symptoms over the past week and was recently started on Farxiga. She was encouraged to continue with OTC options if these are helping but will provide Rx for Diflucan if symptoms do not start to improve over the weekend.   Medication Adjustments/Labs and Tests Ordered: Current medicines are reviewed at length with the patient today.  Concerns regarding medicines are outlined above.  Medication changes, Labs and Tests ordered today are listed in the  Patient Instructions below. Patient Instructions   Medication Instructions:  Your physician recommends that you continue on your current medications as directed. Please refer to the Current Medication list given to you today.  *If you need a refill on your cardiac medications before your next appointment, please call your pharmacy*   Lab Work: None If you have labs (blood work) drawn today and your tests are completely normal, you will receive your results only by: [place] (if you have  MyChart) OR A paper copy in the mail If you have any lab test that is abnormal or we need to change your treatment, we will call you to review the results.   Testing/Procedures: None   Follow-Up: At [facility], you and your health needs are our priority.  As part of our continuing mission to provide you with exceptional heart care, we have created designated Provider Care Teams.  These Care Teams include your primary Cardiologist (physician) and Advanced Practice Providers (APPs -  Physician Assistants and Nurse Practitioners) who all work together to provide you with the care you need, when you need it.  We recommend signing up for the patient portal called "MyChart".  Sign up information is provided on this After Visit Summary.  MyChart is used to connect with patients for Virtual Visits (Telemedicine).  Patients are able to view lab/test results, encounter notes, upcoming appointments, etc.  Non-urgent messages can be sent to your provider as well.   To learn more about what you can do with MyChart, go to [email or web address].    Your next appointment:   6 month(s)  The format for your next appointment:   In Person  Provider:   You may see Carlyle Dolly, MD or one of the following Advanced Practice Providers on your designated Care Team:   Bernerd Pho, PA-C     Other Instructions     Signed, Waynetta Pean  09/13/2021 4:49 PM    [facility]. [address] [place], [place] [address] Phone: [phone] Fax: [phone]

## 2021-09-13 NOTE — Patient Instructions (Signed)
Medication Instructions:  Your physician recommends that you continue on your current medications as directed. Please refer to the Current Medication list given to you today.  *If you need a refill on your cardiac medications before your next appointment, please call your pharmacy*   Lab Work: None If you have labs (blood work) drawn today and your tests are completely normal, you will receive your results only by: [place] (if you have MyChart) OR A paper copy in the mail If you have any lab test that is abnormal or we need to change your treatment, we will call you to review the results.   Testing/Procedures: None   Follow-Up: At [facility], you and your health needs are our priority.  As part of our continuing mission to provide you with exceptional heart care, we have created designated Provider Care Teams.  These Care Teams include your primary Cardiologist (physician) and Advanced Practice Providers (APPs -  Physician Assistants and Nurse Practitioners) who all work together to provide you with the care you need, when you need it.  We recommend signing up for the patient portal called "MyChart".  Sign up information is provided on this After Visit Summary.  MyChart is used to connect with patients for Virtual Visits (Telemedicine).  Patients are able to view lab/test results, encounter notes, upcoming appointments, etc.  Non-urgent messages can be sent to your provider as well.   To learn more about what you can do with MyChart, go to [email or web address].    Your next appointment:   6 month(s)  The format for your next appointment:   In Person  Provider:   You may see Carlyle Dolly, MD or one of the following Advanced Practice Providers on your designated Care Team:   Bernerd Pho, [place]     Other Instructions

## 2021-10-15 ENCOUNTER — Encounter (INDEPENDENT_AMBULATORY_CARE_PROVIDER_SITE_OTHER): Payer: Self-pay | Admitting: *Deleted

## 2021-11-13 DIAGNOSIS — R059 Cough, unspecified: Secondary | ICD-10-CM | POA: Diagnosis not present

## 2021-11-13 DIAGNOSIS — E1169 Type 2 diabetes mellitus with other specified complication: Secondary | ICD-10-CM | POA: Diagnosis not present

## 2021-12-09 ENCOUNTER — Telehealth: Payer: Self-pay | Admitting: *Deleted

## 2021-12-09 ENCOUNTER — Encounter (INDEPENDENT_AMBULATORY_CARE_PROVIDER_SITE_OTHER): Payer: Self-pay | Admitting: *Deleted

## 2021-12-09 NOTE — Telephone Encounter (Signed)
   Pre-operative Risk Assessment    Patient Name: Margaret [name]  DOB: Apr 30, 1976 MRN: [id number]      Request for Surgical Clearance    Procedure:   SCREENING COLONOSCOPY  Date of Surgery:  Clearance TBD                                 Surgeon:  DR. [place] Surgeon's Group or Practice Name:  [facility] Phone number:  [phone] Fax number:  [phone]   Type of Clearance Requested:   - Medical    Type of Anesthesia:  MAC   Additional requests/questions:    Jiles Prows   12/09/2021, 4:13 PM

## 2021-12-11 NOTE — Telephone Encounter (Signed)
° °  Name: Margaret [name]  DOB: 1976-09-17  MRN: [id number]   Primary Cardiologist: Carlyle Dolly, MD  Chart reviewed as part of pre-operative protocol coverage. Patient was contacted 12/11/2021 in reference to pre-operative risk assessment for pending surgery as outlined below.  Margaret [name] was last seen 08/2021 by Dr. Bernerd Pho PA-C, to return again in 6 months. Last echo 03/2021 showed EF 50% (previously as low as 15-20%). I reached out to patient for update on how she is doing. The patient affirms she has been doing well without any new cardiac symptoms. Therefore, based on ACC/AHA guidelines, the patient would be at acceptable risk for the planned procedure without further cardiovascular testing. The patient was advised that if she develops new symptoms prior to surgery to contact our office to arrange for a follow-up visit, and she verbalized understanding.  I will route this recommendation to the requesting party via Epic fax function and remove from pre-op pool. Please call with questions.  Charlie Pitter, PA-C 12/11/2021, 9:44 AM

## 2021-12-20 DIAGNOSIS — E782 Mixed hyperlipidemia: Secondary | ICD-10-CM | POA: Diagnosis not present

## 2021-12-20 DIAGNOSIS — E1169 Type 2 diabetes mellitus with other specified complication: Secondary | ICD-10-CM | POA: Diagnosis not present

## 2021-12-20 DIAGNOSIS — E041 Nontoxic single thyroid nodule: Secondary | ICD-10-CM | POA: Diagnosis not present

## 2021-12-25 DIAGNOSIS — E041 Nontoxic single thyroid nodule: Secondary | ICD-10-CM | POA: Diagnosis not present

## 2021-12-25 DIAGNOSIS — E782 Mixed hyperlipidemia: Secondary | ICD-10-CM | POA: Diagnosis not present

## 2021-12-25 DIAGNOSIS — I5033 Acute on chronic diastolic (congestive) heart failure: Secondary | ICD-10-CM | POA: Diagnosis not present

## 2021-12-25 DIAGNOSIS — E1169 Type 2 diabetes mellitus with other specified complication: Secondary | ICD-10-CM | POA: Diagnosis not present

## 2021-12-29 DIAGNOSIS — J189 Pneumonia, unspecified organism: Secondary | ICD-10-CM

## 2021-12-29 HISTORY — DX: Pneumonia, unspecified organism: J18.9

## 2022-01-14 DIAGNOSIS — R062 Wheezing: Secondary | ICD-10-CM | POA: Diagnosis not present

## 2022-01-14 DIAGNOSIS — R059 Cough, unspecified: Secondary | ICD-10-CM | POA: Diagnosis not present

## 2022-01-14 DIAGNOSIS — J209 Acute bronchitis, unspecified: Secondary | ICD-10-CM | POA: Diagnosis not present

## 2022-01-24 ENCOUNTER — Other Ambulatory Visit (HOSPITAL_COMMUNITY): Payer: Self-pay | Admitting: Internal Medicine

## 2022-01-24 ENCOUNTER — Ambulatory Visit (HOSPITAL_COMMUNITY)
Admission: RE | Admit: 2022-01-24 | Discharge: 2022-01-24 | Disposition: A | Discharge status: Home / Self Care | Payer: BC Managed Care – PPO | Source: Ambulatory Visit | Attending: Internal Medicine | Admitting: Internal Medicine

## 2022-01-24 ENCOUNTER — Other Ambulatory Visit: Payer: Self-pay

## 2022-01-24 DIAGNOSIS — R059 Cough, unspecified: Secondary | ICD-10-CM | POA: Diagnosis not present

## 2022-01-24 DIAGNOSIS — R062 Wheezing: Secondary | ICD-10-CM | POA: Diagnosis not present

## 2022-01-24 DIAGNOSIS — R5383 Other fatigue: Secondary | ICD-10-CM | POA: Diagnosis not present

## 2022-01-24 DIAGNOSIS — J069 Acute upper respiratory infection, unspecified: Secondary | ICD-10-CM | POA: Diagnosis not present

## 2022-01-24 DIAGNOSIS — R0902 Hypoxemia: Secondary | ICD-10-CM | POA: Diagnosis not present

## 2022-01-24 DIAGNOSIS — R079 Chest pain, unspecified: Secondary | ICD-10-CM | POA: Diagnosis not present

## 2022-01-27 ENCOUNTER — Encounter (INDEPENDENT_AMBULATORY_CARE_PROVIDER_SITE_OTHER): Payer: Self-pay | Admitting: *Deleted

## 2022-01-27 ENCOUNTER — Telehealth (INDEPENDENT_AMBULATORY_CARE_PROVIDER_SITE_OTHER): Payer: Self-pay

## 2022-01-27 ENCOUNTER — Encounter (INDEPENDENT_AMBULATORY_CARE_PROVIDER_SITE_OTHER): Payer: Self-pay

## 2022-01-27 ENCOUNTER — Other Ambulatory Visit (INDEPENDENT_AMBULATORY_CARE_PROVIDER_SITE_OTHER): Payer: Self-pay

## 2022-01-27 DIAGNOSIS — I10 Essential (primary) hypertension: Secondary | ICD-10-CM

## 2022-01-27 DIAGNOSIS — Z01812 Encounter for preprocedural laboratory examination: Secondary | ICD-10-CM

## 2022-01-27 DIAGNOSIS — Z1211 Encounter for screening for malignant neoplasm of colon: Secondary | ICD-10-CM

## 2022-01-27 MED ORDER — PEG 3350-KCL-NA BICARB-NACL 420 G PO SOLR: 4000.0000 mL | ORAL | 0 refills | Status: DC

## 2022-01-27 NOTE — Telephone Encounter (Signed)
Referring MD/PCP: HALL  Procedure: TCS  Reason/Indication:  SCREENING   Has patient had this procedure before?  NO  If so, when, by whom and where?    Is there a family history of colon cancer?  NO  Who?  What age when diagnosed?    Is patient diabetic? If yes, Type 1 or Type 2   YES, TYPE 2      Does patient have prosthetic heart valve or mechanical valve?  NO  Do you have a pacemaker/defibrillator?  NO  Has patient ever had endocarditis/atrial fibrillation? NO  Does patient use oxygen? NO  Has patient had joint replacement within last 12 months?  NO  Is patient constipated or do they take laxatives? NO  Does patient have a history of alcohol/drug use?  NO  Have you had a stroke/heart attack last 6 mths? NO  Do you take medicine for weight loss?  NO  For female patients,: have you had a hysterectomy NO                      are you post menopausal NO                      do you still have your menstrual cycle YES, SOMETIMES  Is patient on blood thinner such as Coumadin, Plavix and/or Aspirin? NO  Medications: SPIRONOLACTONE 25 MG DAILY,POTASSIUM 20 MG DAILY, FUROSEMIDE 150 MG DAILY, CARVEDILOL 6.25 MG 2 TAB NIGHTLY, FLUTICASONE NASAL SPRAY, ATORVASTATIN 10 MG Mon Wed Fri, Farixga 10 mg daily   Allergies: sulfur, codeine  Medication Adjustment per Dr Jenetta Downer no farxiga the evening prior or the morning of procedure   Procedure date & time: 02/14/22 at 1:45

## 2022-01-27 NOTE — Telephone Encounter (Signed)
Margaret [name], CMA  ?

## 2022-01-28 NOTE — Telephone Encounter (Signed)
Ok to schedule. Thanks

## 2022-02-13 ENCOUNTER — Other Ambulatory Visit (HOSPITAL_COMMUNITY)
Admission: RE | Admit: 2022-02-13 | Discharge: 2022-02-13 | Disposition: A | Discharge status: Home / Self Care | Payer: BC Managed Care – PPO | Source: Ambulatory Visit | Attending: Gastroenterology | Admitting: Gastroenterology

## 2022-02-13 DIAGNOSIS — Z7984 Long term (current) use of oral hypoglycemic drugs: Secondary | ICD-10-CM | POA: Diagnosis not present

## 2022-02-13 DIAGNOSIS — K5730 Diverticulosis of large intestine without perforation or abscess without bleeding: Secondary | ICD-10-CM | POA: Diagnosis not present

## 2022-02-13 DIAGNOSIS — K648 Other hemorrhoids: Secondary | ICD-10-CM | POA: Diagnosis not present

## 2022-02-13 DIAGNOSIS — I10 Essential (primary) hypertension: Secondary | ICD-10-CM | POA: Insufficient documentation

## 2022-02-13 DIAGNOSIS — Z6841 Body Mass Index (BMI) 40.0 and over, adult: Secondary | ICD-10-CM | POA: Diagnosis not present

## 2022-02-13 DIAGNOSIS — Z79899 Other long term (current) drug therapy: Secondary | ICD-10-CM | POA: Diagnosis not present

## 2022-02-13 DIAGNOSIS — Z01812 Encounter for preprocedural laboratory examination: Secondary | ICD-10-CM | POA: Insufficient documentation

## 2022-02-13 DIAGNOSIS — I509 Heart failure, unspecified: Secondary | ICD-10-CM | POA: Diagnosis not present

## 2022-02-13 DIAGNOSIS — K219 Gastro-esophageal reflux disease without esophagitis: Secondary | ICD-10-CM | POA: Diagnosis not present

## 2022-02-13 DIAGNOSIS — I110 Hypertensive heart disease with heart failure: Secondary | ICD-10-CM | POA: Diagnosis not present

## 2022-02-13 DIAGNOSIS — Z1211 Encounter for screening for malignant neoplasm of colon: Secondary | ICD-10-CM | POA: Insufficient documentation

## 2022-02-13 DIAGNOSIS — E119 Type 2 diabetes mellitus without complications: Secondary | ICD-10-CM | POA: Diagnosis not present

## 2022-02-13 DIAGNOSIS — I428 Other cardiomyopathies: Secondary | ICD-10-CM | POA: Diagnosis not present

## 2022-02-13 LAB — BASIC METABOLIC PANEL
Anion gap: 13 (ref 5–15)
BUN: 13 mg/dL (ref 6–20)
CO2: 23 mmol/L (ref 22–32)
Calcium: 9 mg/dL (ref 8.9–10.3)
Chloride: 101 mmol/L (ref 98–111)
Creatinine, Ser: 0.73 mg/dL (ref 0.44–1.00)
GFR, Estimated: >60 mL/min (ref >60)
Glucose, Bld: 99 mg/dL (ref 70–99)
Potassium: 3.4 mmol/L — ABNORMAL LOW (ref 3.5–5.1)
Sodium: 137 mmol/L (ref 135–145)

## 2022-02-13 LAB — PREGNANCY, URINE: Preg Test, Ur: NEGATIVE

## 2022-02-14 ENCOUNTER — Ambulatory Visit (HOSPITAL_COMMUNITY)
Admission: RE | Admit: 2022-02-14 | Discharge: 2022-02-14 | Disposition: A | Discharge status: Home / Self Care | Payer: BC Managed Care – PPO | Attending: Gastroenterology | Admitting: Gastroenterology

## 2022-02-14 ENCOUNTER — Encounter (HOSPITAL_COMMUNITY): Payer: Self-pay | Admitting: Gastroenterology

## 2022-02-14 ENCOUNTER — Ambulatory Visit (HOSPITAL_COMMUNITY): Payer: BC Managed Care – PPO | Admitting: Anesthesiology

## 2022-02-14 ENCOUNTER — Encounter (HOSPITAL_COMMUNITY)
Admission: RE | Disposition: A | Discharge status: Home / Self Care | Payer: Self-pay | Source: Home / Self Care | Attending: Gastroenterology

## 2022-02-14 ENCOUNTER — Other Ambulatory Visit: Payer: Self-pay

## 2022-02-14 DIAGNOSIS — Z6841 Body Mass Index (BMI) 40.0 and over, adult: Secondary | ICD-10-CM | POA: Insufficient documentation

## 2022-02-14 DIAGNOSIS — Z79899 Other long term (current) drug therapy: Secondary | ICD-10-CM | POA: Diagnosis not present

## 2022-02-14 DIAGNOSIS — E119 Type 2 diabetes mellitus without complications: Secondary | ICD-10-CM | POA: Diagnosis not present

## 2022-02-14 DIAGNOSIS — Z7984 Long term (current) use of oral hypoglycemic drugs: Secondary | ICD-10-CM | POA: Insufficient documentation

## 2022-02-14 DIAGNOSIS — Z1211 Encounter for screening for malignant neoplasm of colon: Secondary | ICD-10-CM | POA: Diagnosis not present

## 2022-02-14 DIAGNOSIS — I428 Other cardiomyopathies: Secondary | ICD-10-CM | POA: Diagnosis not present

## 2022-02-14 DIAGNOSIS — I509 Heart failure, unspecified: Secondary | ICD-10-CM | POA: Diagnosis not present

## 2022-02-14 DIAGNOSIS — K648 Other hemorrhoids: Secondary | ICD-10-CM | POA: Diagnosis not present

## 2022-02-14 DIAGNOSIS — K219 Gastro-esophageal reflux disease without esophagitis: Secondary | ICD-10-CM | POA: Insufficient documentation

## 2022-02-14 DIAGNOSIS — K5730 Diverticulosis of large intestine without perforation or abscess without bleeding: Secondary | ICD-10-CM

## 2022-02-14 DIAGNOSIS — I110 Hypertensive heart disease with heart failure: Secondary | ICD-10-CM | POA: Diagnosis not present

## 2022-02-14 HISTORY — PX: COLONOSCOPY WITH PROPOFOL: SHX5780

## 2022-02-14 HISTORY — DX: Pure hypercholesterolemia, unspecified: E78.00

## 2022-02-14 HISTORY — DX: Type 2 diabetes mellitus without complications: E11.9

## 2022-02-14 HISTORY — DX: Gastro-esophageal reflux disease without esophagitis: K21.9

## 2022-02-14 LAB — GLUCOSE, CAPILLARY: Glucose-Capillary: 90 mg/dL (ref 70–99)

## 2022-02-14 LAB — HM COLONOSCOPY

## 2022-02-14 SURGERY — COLONOSCOPY WITH PROPOFOL
Anesthesia: General

## 2022-02-14 MED ORDER — PROPOFOL 500 MG/50ML IV EMUL
INTRAVENOUS | Status: DC | PRN
Start: 2022-02-14 — End: 2022-02-14
  Administered 2022-02-14: 150 ug/kg/min via INTRAVENOUS

## 2022-02-14 MED ORDER — LACTATED RINGERS IV SOLN: INTRAVENOUS | Status: DC

## 2022-02-14 MED ORDER — STERILE WATER FOR IRRIGATION IR SOLN
Status: DC | PRN
  Administered 2022-02-14: 60 mL

## 2022-02-14 MED ORDER — PROPOFOL 10 MG/ML IV BOLUS
INTRAVENOUS | Status: DC | PRN
  Administered 2022-02-14 (×2): 50 mg via INTRAVENOUS

## 2022-02-14 NOTE — Anesthesia Postprocedure Evaluation (Signed)
Anesthesia Post Note  Patient: Margaret [name] [facility]  Procedure(s) Performed: COLONOSCOPY WITH PROPOFOL  Patient location during evaluation: Endoscopy Anesthesia Type: General Level of consciousness: awake and alert Pain management: pain level controlled Vital Signs Assessment: post-procedure vital signs reviewed and stable Respiratory status: spontaneous breathing Cardiovascular status: blood pressure returned to baseline and stable Postop Assessment: no apparent nausea or vomiting Anesthetic complications: no   No notable events documented.   Last Vitals:  Vitals:   02/14/22 1211 02/14/22 1419  BP: 132/68 104/85  Pulse: 97   Resp: 20 (!) 26  Temp: 37 C 36.4 C  SpO2: 96% 94%    Last Pain:  Vitals:   02/14/22 1419  TempSrc: Axillary  PainSc: 0-No pain                 Karlena Luebke

## 2022-02-14 NOTE — Transfer of Care (Signed)
Immediate Anesthesia Transfer of Care Note  Patient: Margaret [name]  Procedure(s) Performed: COLONOSCOPY WITH PROPOFOL  Patient Location: Endoscopy Unit  Anesthesia Type:General  Level of Consciousness: awake  Airway & Oxygen Therapy: Patient Spontanous Breathing  Post-op Assessment: Report given to RN  Post vital signs: Reviewed and stable  Last Vitals:  Vitals Value Taken Time  BP    Temp    Pulse    Resp    SpO2      Last Pain:  Vitals:   02/14/22 1359  TempSrc:   PainSc: 0-No pain      Patients Stated Pain Goal: 3 (72/90/21 1155)  Complications: No notable events documented.

## 2022-02-14 NOTE — Op Note (Signed)
[facility] Patient Name: Margaret [name] Procedure Date: 02/14/2022 1:45 PM MRN: [id number] Date of Birth: [birth year] Attending MD: Maylon Peppers ,  CSN: [id number] Age: 46 Admit Type: Outpatient Procedure:                Colonoscopy Indications:              Screening for colorectal malignant neoplasm Providers:                Maylon Peppers, Lambert Mody, Kristine L.                            Risa Grill, Technician Referring MD:              Medicines:                Monitored Anesthesia Care Complications:            No immediate complications. Estimated Blood Loss:     Estimated blood loss: none. Procedure:                Pre-Anesthesia Assessment:                           - Prior to the procedure, a History and Physical                            was performed, and patient medications, allergies                            and sensitivities were reviewed. The patient's                            tolerance of previous anesthesia was reviewed.                           - The risks and benefits of the procedure and the                            sedation options and risks were discussed with the                            patient. All questions were answered and informed                            consent was obtained.                           - ASA Grade Assessment: II - A patient with mild                            systemic disease.                           After obtaining informed consent, the colonoscope                            was passed under direct vision. Throughout the  procedure, the patient's blood pressure, pulse, and                            oxygen saturations were monitored continuously. The                            [id number]-HQ190L ([id number]) scope was introduced through                            the anus and advanced to the the cecum, identified                            by appendiceal orifice and ileocecal valve. The                             colonoscopy was performed without difficulty. The                            patient tolerated the procedure well. The quality                            of the bowel preparation was good. Scope In: 2:01:36 PM Scope Out: 2:15:29 PM Scope Withdrawal Time: 0 hours 10 minutes 52 seconds  Total Procedure Duration: 0 hours 13 minutes 53 seconds  Findings:      The perianal and digital rectal examinations were normal.      A few large-mouthed diverticula were found in the descending colon and       ascending colon.      Non-bleeding internal hemorrhoids were found during retroflexion. The       hemorrhoids were small. Impression:               - Diverticulosis in the descending colon and in the                            ascending colon.                           - Non-bleeding internal hemorrhoids.                           - No specimens collected. Moderate Sedation:      Per Anesthesia Care Recommendation:           - Discharge patient to home (ambulatory).                           - Resume previous diet.                           - Repeat colonoscopy in 10 years for screening                            purposes. Procedure Code(s):        --- Professional ---  V2023, Colorectal cancer screening; colonoscopy on                            individual not meeting criteria for high risk Diagnosis Code(s):        --- Professional ---                           Z12.11, Encounter for screening for malignant                            neoplasm of colon                           K64.8, Other hemorrhoids                           K57.30, Diverticulosis of large intestine without                            perforation or abscess without bleeding CPT copyright 2019 American Medical Association. All rights reserved. The codes documented in this report are preliminary and upon coder review may  be revised to meet current compliance  requirements. Maylon Peppers, MD Maylon Peppers,  02/14/2022 2:17:47 PM This report has been signed electronically. Number of Addenda: 0

## 2022-02-14 NOTE — Discharge Instructions (Addendum)
You are being discharged to home.  Resume your previous diet.  Your physician has recommended a repeat colonoscopy in 10 years for screening purposes.  

## 2022-02-14 NOTE — Anesthesia Preprocedure Evaluation (Addendum)
Anesthesia Evaluation  Patient identified by MRN, date of birth, ID band Patient awake    Reviewed: Allergy & Precautions, NPO status , Patient's Chart, lab work & pertinent test results, reviewed documented beta blocker date and time   History of Anesthesia Complications Negative for: history of anesthetic complications  Airway Mallampati: II  TM Distance: >3 FB Neck ROM: Full    Dental  (+) Dental Advisory Given   Pulmonary pneumonia,    Pulmonary exam normal breath sounds clear to auscultation       Cardiovascular Exercise Tolerance: Good (-) hypertensionPt. on home beta blockers +CHF  Normal cardiovascular exam+ dysrhythmias Supra Ventricular Tachycardia  Rhythm:Regular Rate:Normal  Erma Heritage, PA-C  04/05/2021 12:13 PM EDT   Please let the patient know her echocardiogram shows her EF remains stable at 50% (normal is 55-65%) but this is significantly improved from prior imaging as her EF was at 15-20% by echo in 12/2018 and at 45% by echo in 02/2020. She did have mildly abnormal relaxation of the heart muscle and good blood pressure control is essential to help with this. She had trivial leakage along the mitral valve but no significant valve abnormalities. Overall, a reassuring study     Neuro/Psych negative neurological ROS  negative psych ROS   GI/Hepatic Neg liver ROS, GERD  Medicated and Controlled,  Endo/Other  diabetes, Well Controlled, Type 2, Oral Hypoglycemic AgentsMorbid obesity  Renal/GU negative Renal ROS  negative genitourinary   Musculoskeletal negative musculoskeletal ROS (+)   Abdominal   Peds negative pediatric ROS (+)  Hematology negative hematology ROS (+)   Anesthesia Other Findings   Reproductive/Obstetrics negative OB ROS                            Anesthesia Physical Anesthesia Plan  ASA: 3  Anesthesia Plan: General   Post-op Pain Management:  Minimal or no pain anticipated   Induction: Intravenous  PONV Risk Score and Plan: TIVA  Airway Management Planned: Nasal Cannula and Natural Airway  Additional Equipment:   Intra-op Plan:   Post-operative Plan:   Informed Consent: I have reviewed the patients History and Physical, chart, labs and discussed the procedure including the risks, benefits and alternatives for the proposed anesthesia with the patient or authorized representative who has indicated his/her understanding and acceptance.     Dental advisory given  Plan Discussed with: Surgeon and CRNA  Anesthesia Plan Comments:        Anesthesia Quick Evaluation

## 2022-02-14 NOTE — H&P (Signed)
Margaret [name] is an 46 y.o. female.   Chief Complaint: Colorectal cancer screening HPI: 46 year old female with past medical history of diabetes, GERD, hyperlipidemia, nonischemic cardiomyopathy, SVT, coming for screening colonoscopy. The patient has never had a colonoscopy in the past.  The patient denies having any complaints such as melena, hematochezia, abdominal pain or distention, change in her bowel movement consistency or frequency, no changes in her weight recently.  No family history of colorectal cancer.   Past Medical History:  Diagnosis Date   Diabetes mellitus without complication (HCC)    GERD (gastroesophageal reflux disease)    Hypercholesteremia    NICM (nonischemic cardiomyopathy) ([place])    a. EF 15-20% by echo in 12/2018 with cath showing normal cors and no evidence of myocarditis or infiltrating disease by cardiac MRI b. EF at 45-50% by echo in 02/2020)   Pneumonia 12/2021   SVT (supraventricular tachycardia) [facility])     Past Surgical History:  Procedure Laterality Date   [place]; 2007   ELECTROPHYSIOLOGIC STUDY N/A 01/09/2016   Procedure: SVT Ablation;  Surgeon: Evans Lance, MD;  Location: [place] [facility];  Service: Cardiovascular;  Laterality: N/A;   LAPAROSCOPIC CHOLECYSTECTOMY  1998   MICROLARYNGOSCOPY Left 04/26/2018   Procedure: MICROLARYNGOSCOPY WITH EXCISION OF VALLECULER MASS;  Surgeon: Leta Baptist, MD;  Location: [facility];  Service: ENT;  Laterality: Left;   RIGHT/LEFT HEART CATH AND CORONARY ANGIOGRAPHY N/A 02/09/2019   Procedure: RIGHT/LEFT HEART CATH AND CORONARY ANGIOGRAPHY;  Surgeon: Martinique, Peter M, MD;  Location: [facility];  Service: Cardiovascular;  Laterality: N/A;   SUPRAVENTRICULAR TACHYCARDIA ABLATION  01/09/2016   TUBAL LIGATION  2007    Family History  Problem Relation Age of Onset   Hypertension Mother    Heart failure Father    Hypertension Father    Stroke Father    Colon  cancer Neg Hx    Social History:  reports that she has never smoked. She has never used smokeless tobacco. She reports that she does not drink alcohol and does not use drugs.  Allergies:  Allergies  Allergen Reactions   Entresto [Sacubitril-Valsartan]     LIP SWELLING    Codeine Hives and Itching   Losartan Swelling    Facial swelling   Sulfa Antibiotics Hives    Medications Prior to Admission  Medication Sig Dispense Refill   atorvastatin (LIPITOR) 10 MG tablet TAKE 1 TABLET BY MOUTH AT NIGHT ON MONDAY, WEDNESDAY AND FRIDAY FOR CHOLESTEROL     carvedilol (COREG) 6.25 MG tablet TAKE ONE TABLET (6.25MG) EVERY MORNING AND TAKE TWO TABLETS (12.5MG) EVERY EVENING 270 tablet 3   fluticasone (FLONASE) 50 MCG/ACT nasal spray Place 1 spray into both nostrils daily as needed for allergies.     furosemide (LASIX) 40 MG tablet TAKE 40 MG DAILY BY MOUTH.  MAY TAKE AN ADDITIONAL 40 MG DAILY AS NEEDED FOR SHORTNESS OF BREATH OR SWELLING. 180 tablet 3   polyethylene glycol-electrolytes (TRILYTE) 420 g solution Take 4,000 mLs by mouth as directed. 4000 mL 0   potassium chloride SA (KLOR-CON) 20 MEQ tablet TAKE ONE TABLET (20MEQ TOTAL) BY MOUTH DAILY 90 tablet 3   RYBELSUS 14 MG TABS Take 14 mg by mouth every morning.     spironolactone (ALDACTONE) 25 MG tablet Take 0.5 tablets (12.5 mg total) by mouth daily. 45 tablet 3   Blood Glucose Monitoring Suppl (CONTOUR NEXT MONITOR) w/Device KIT USE TO CHECK  BLOOD SUGAR PER PACKAGE INSTRUCTIONS     CONTOUR NEXT TEST test strip 2 (two) times daily.     Microlet Lancets MISC USE TO CHECK BLOOD SUGAR TWICE A DAY      Results for orders placed or performed during the hospital encounter of 02/14/22 (from the past 48 hour(s))  Glucose, capillary     Status: None   Collection Time: 02/14/22 12:14 PM  Result Value Ref Range   Glucose-Capillary 90 70 - 99 mg/dL    Comment: Glucose reference range applies only to samples taken after fasting for at least 8 hours.    No results found.  Review of Systems  Constitutional: Negative.   HENT: Negative.    Eyes: Negative.   Respiratory: Negative.    Cardiovascular: Negative.   Gastrointestinal: Negative.   Endocrine: Negative.   Genitourinary: Negative.   Musculoskeletal: Negative.   Skin: Negative.   Allergic/Immunologic: Negative.   Neurological: Negative.   Hematological: Negative.   Psychiatric/Behavioral: Negative.     Blood pressure 132/68, pulse 97, temperature 98.6 F (37 C), temperature source Oral, resp. rate 20, height _0  (1.6 m), weight 112 kg, SpO2 96 %. Physical Exam  GENERAL: The patient is AO x3, in no acute distress. HEENT: Head is normocephalic and atraumatic. EOMI are intact. Mouth is well hydrated and without lesions. NECK: Supple. No masses LUNGS: Clear to auscultation. No presence of rhonchi/wheezing/rales. Adequate chest expansion HEART: RRR, normal s1 and s2. ABDOMEN: Soft, nontender, no guarding, no peritoneal signs, and nondistended. BS +. No masses. EXTREMITIES: Without any cyanosis, clubbing, rash, lesions or edema. NEUROLOGIC: AOx3, no focal motor deficit. SKIN: no jaundice, no rashes  Assessment/Plan 46 year old female with past medical history of diabetes, GERD, hyperlipidemia, nonischemic cardiomyopathy, SVT, coming for screening colonoscopy. The patient is at average risk for colorectal cancer.  We will proceed with colonoscopy today.   Harvel Quale, MD 02/14/2022, 12:31 PM

## 2022-02-18 ENCOUNTER — Encounter (INDEPENDENT_AMBULATORY_CARE_PROVIDER_SITE_OTHER): Payer: Self-pay | Admitting: *Deleted

## 2022-02-19 ENCOUNTER — Encounter (HOSPITAL_COMMUNITY): Payer: Self-pay | Admitting: Gastroenterology

## 2022-03-12 ENCOUNTER — Ambulatory Visit: Payer: BC Managed Care – PPO | Admitting: Student

## 2022-04-11 ENCOUNTER — Ambulatory Visit (INDEPENDENT_AMBULATORY_CARE_PROVIDER_SITE_OTHER): Payer: BC Managed Care – PPO | Admitting: Student

## 2022-04-11 ENCOUNTER — Telehealth: Payer: Self-pay

## 2022-04-11 ENCOUNTER — Encounter: Payer: Self-pay | Admitting: Student

## 2022-04-11 VITALS — Ht 63.0 in | Wt 245.0 lb

## 2022-04-11 DIAGNOSIS — Z8679 Personal history of other diseases of the circulatory system: Secondary | ICD-10-CM

## 2022-04-11 DIAGNOSIS — I471 Supraventricular tachycardia: Secondary | ICD-10-CM | POA: Diagnosis not present

## 2022-04-11 DIAGNOSIS — I493 Ventricular premature depolarization: Secondary | ICD-10-CM

## 2022-04-11 NOTE — Patient Instructions (Signed)
Medication Instructions:  Your physician recommends that you continue on your current medications as directed. Please refer to the Current Medication list given to you today.   Labwork: None today  Testing/Procedures: None today  Follow-Up: 6 months  Any Other Special Instructions Will Be Listed Below (If Applicable).  If you need a refill on your cardiac medications before your next appointment, please call your pharmacy.  

## 2022-04-11 NOTE — Progress Notes (Signed)
? ?Virtual Visit via Telephone Note  ? ?This visit type was conducted due to national recommendations for restrictions regarding the COVID-19 Pandemic (e.g. social distancing) in an effort to limit this patient's exposure and mitigate transmission in our community.  Due to her co-morbid illnesses, this patient is at least at moderate risk for complications without adequate follow up.  This format is felt to be most appropriate for this patient at this time.  The patient did not have access to video technology/had technical difficulties with video requiring transitioning to audio format only (telephone).  All issues noted in this document were discussed and addressed.  No physical exam could be performed with this format.  Please refer to the patient's chart for her  consent to telehealth for [facility].  ? ?Date:  04/11/2022  ? ?ID:  Margaret [name], DOB [birth year], MRN [id number] ?The patient was identified using 2 identifiers. ? ?Patient Location: Home ?Provider Location: Office/Clinic ? ?PCP:  Celene Squibb, MD ?  ?[facility] Providers ?Cardiologist:  Carlyle Dolly, MD    ? ?Evaluation Performed:  Follow-Up Visit ? ?Chief Complaint: 6 month visit ? ?History of Present Illness:   ? ?THUY ATILANO is a 46 y.o. female with with past medical history of HFimpEF/NICM (EF 15-20% by echo in 12/2018 with cath showing normal cors and no evidence of myocarditis or infiltrating disease by cardiac MRI, EF at 45-50% by echo in 02/2020 and 50% in 03/2021), PVC's and SVT (s/p ablation in 12/2015) who presents for a 69-monthfollow-up telehealth visit. ? ?She was last examined by myself in 08/2021 and had been under increased stress as her husband recently underwent bypass surgery. She was overall doing well at that time and denied any recent chest pain or palpitations. She was continued on Coreg, Farxiga and Spironolactone as she previously had angioedema with Losartan and Entresto in the past. ? ?In talking with the  patient today, she reports overall doing well since her last office visit.  Several of her diabetic medications have been adjusted by her PCP as she experienced diarrhea with Metformin and FWilder Gladeand was recently started on Rybelsus and reports constipation with this. She has been trying to make dietary changes but this has been challenging as she does not consume fruits or vegetables. She denies any recent chest pain or dyspnea on exertion when doing routine activities but does report she was very short of breath when walking trails at [employer] few weekends ago. Reports this is out of her comfort zone. She does walk on a flat surface at work without any symptoms.  No specific orthopnea, PND or palpitations. She does experience intermittent lower extremity edema and takes Lasix 40 mg daily. She has not had to take an extra tablet recently. ? ?The patient does not have symptoms concerning for COVID-19 infection (fever, chills, cough, or new shortness of breath).  ? ? ?Past Medical History:  ?Diagnosis Date  ? [place] complication [place]   ? GERD (gastroesophageal reflux disease)   ? Hypercholesteremia   ? NICM (nonischemic cardiomyopathy) ([place]   ? a. EF 15-20% by echo in 12/2018 with cath showing normal cors and no evidence of myocarditis or infiltrating disease by cardiac MRI b. EF at 45-50% by echo in 02/2020  ? Pneumonia 12/2021  ? SVT (supraventricular tachycardia) ([place]   ? ?Past Surgical History:  ?Procedure Laterality Date  ? APPENDECTOMY  1981  ? CESAREAN SECTION  1999; 2007  ? COLONOSCOPY WITH  PROPOFOL N/A 02/14/2022  ? Procedure: COLONOSCOPY WITH PROPOFOL;  Surgeon: Harvel Quale, MD;  [facility];  Service: Gastroenterology;  Laterality: N/A;  145  ? ELECTROPHYSIOLOGIC STUDY N/A 01/09/2016  ? Procedure: SVT Ablation;  Surgeon: Evans Lance, MD;  Location: [place] [facility];  Service: Cardiovascular;  Laterality: N/A;  ? [place]  ?  MICROLARYNGOSCOPY Left 04/26/2018  ? Procedure: MICROLARYNGOSCOPY WITH EXCISION OF VALLECULER MASS;  Surgeon: Leta Baptist, MD;  Location: [facility];  Service: ENT;  Laterality: Left;  ? RIGHT/LEFT HEART CATH AND CORONARY ANGIOGRAPHY N/A 02/09/2019  ? Procedure: RIGHT/LEFT HEART CATH AND CORONARY ANGIOGRAPHY;  Surgeon: Martinique, Peter M, MD;  Location: [facility];  Service: Cardiovascular;  Laterality: N/A;  ? SUPRAVENTRICULAR TACHYCARDIA ABLATION  01/09/2016  ? TUBAL LIGATION  2007  ?  ? ?Current Meds  ?Medication Sig  ? atorvastatin (LIPITOR) 10 MG tablet TAKE 1 TABLET BY MOUTH AT NIGHT ON MONDAY, WEDNESDAY AND FRIDAY FOR CHOLESTEROL  ? Blood Glucose Monitoring Suppl (CONTOUR NEXT MONITOR) w/Device KIT USE TO CHECK BLOOD SUGAR PER PACKAGE INSTRUCTIONS  ? carvedilol (COREG) 6.25 MG tablet TAKE ONE TABLET (6.25MG) EVERY MORNING AND TAKE TWO TABLETS (12.5MG) EVERY EVENING  ? CONTOUR NEXT TEST test strip 2 (two) times daily.  ? fluticasone (FLONASE) 50 MCG/ACT nasal spray Place 1 spray into both nostrils daily as needed for allergies.  ? furosemide (LASIX) 40 MG tablet TAKE 40 MG DAILY BY MOUTH.  MAY TAKE AN ADDITIONAL 40 MG DAILY AS NEEDED FOR SHORTNESS OF BREATH OR SWELLING.  ? Microlet Lancets MISC USE TO CHECK BLOOD SUGAR TWICE A DAY  ? potassium chloride SA (KLOR-CON) 20 MEQ tablet TAKE ONE TABLET (20MEQ TOTAL) BY MOUTH DAILY  ? RYBELSUS 14 MG TABS Take 14 mg by mouth every morning.  ? spironolactone (ALDACTONE) 25 MG tablet Take 0.5 tablets (12.5 mg total) by mouth daily.  ?  ? ?Allergies:   Entresto [sacubitril-valsartan], Codeine, Losartan, and Sulfa antibiotics  ? ?Social History  ? ?Tobacco Use  ? Smoking status: Never  ? Smokeless tobacco: Never  ?Vaping Use  ? Vaping Use: Never used  ?Substance Use Topics  ? Alcohol use: No  ? Drug use: No  ?  ? ?Family Hx: ?The patient's family history includes Heart failure in her father; Hypertension in her father and mother; Stroke in her father. There  is no history of Colon cancer. ? ?ROS:   ?Please see the history of present illness.    ? ?All other systems reviewed and are negative. ? ? ?Prior CV studies:   ?The following studies were reviewed today: ? ?Echocardiogram: 03/2021 ?IMPRESSIONS  ? ? ? 1. Left ventricular ejection fraction, by estimation, is 50%. The left  ?ventricle has low normal function. Left ventricular endocardial border not  ?optimally defined to evaluate regional wall motion. Left ventricular  ?diastolic parameters are consistent  ?with Grade I diastolic dysfunction (impaired relaxation).  ? 2. Right ventricular systolic function is normal. The right ventricular  ?size is normal. There is normal pulmonary artery systolic pressure.  ? 3. The mitral valve is normal in structure. Trivial mitral valve  ?regurgitation. No evidence of mitral stenosis.  ? 4. The aortic valve has an indeterminant number of cusps. Aortic valve  ?regurgitation is mild. No aortic stenosis is present.  ? 5. The inferior vena cava is normal in size with greater than 50%  ?respiratory variability, suggesting right atrial pressure of 3 mmHg.  ? ?  Labs/Other Tests and Data Reviewed:   ? ?EKG:  No ECG reviewed. ? ?Recent Labs: ?02/13/2022: BUN 13; Creatinine, Ser 0.73; Potassium 3.4; Sodium 137  ? ?Recent Lipid Panel ?No results found for: CHOL, TRIG, HDL, CHOLHDL, LDLCALC, LDLDIRECT ? ?Wt Readings from Last 3 Encounters:  ?04/11/22 245 lb (111.1 kg)  ?02/14/22 247 lb (112 kg)  ?09/13/21 257 lb (116.6 kg)  ?  ? ?Objective:   ? ?Vital Signs:  Ht 5' 3"  (1.6 m)   Wt 245 lb (111.1 kg)   BMI 43.40 kg/m?   ? ?General: Pleasant female sounding in NAD ?Psych: Normal affect. ?Neuro: Alert and oriented X 3.  ?Lungs:  Resp regular and unlabored while talking on the phone.  ? ? ?ASSESSMENT & PLAN:   ? ?1. HFimpEF ?- Her ejection fraction was previously at 15 to 20% in 2020 with catheterization at that time showing normal coronary arteries and no evidence of myocarditis or infiltrative  disease by cardiac MRI. Recent echocardiogram in 03/2021 showed that her EF had improved to 50%. ?- Her respiratory status has overall been stable. Continue current medication regimen with Coreg 6.25 mg in

## 2022-04-11 NOTE — Telephone Encounter (Signed)
?  Patient Consent for Virtual Visit  ? ? ?   ? ?[name] has provided verbal consent on 04/11/2022 for a virtual visit (video or telephone). ? ? ?CONSENT FOR VIRTUAL VISIT FOR:  Margaret [name]  ?By participating in this virtual visit I agree to the following: ? ?I hereby voluntarily request, consent and authorize [facility] and its employed or contracted physicians, physician assistants, nurse practitioners or other licensed health care professionals (the Practitioner), to provide me with telemedicine health care services (the ?Services") as deemed necessary by the treating Practitioner. I acknowledge and consent to receive the Services by the Practitioner via telemedicine. I understand that the telemedicine visit will involve communicating with the Practitioner through live audiovisual communication technology and the disclosure of certain medical information by electronic transmission. I acknowledge that I have been given the opportunity to request an in-person assessment or other available alternative prior to the telemedicine visit and am voluntarily participating in the telemedicine visit. ? ?I understand that I have the right to withhold or withdraw my consent to the use of telemedicine in the course of my care at any time, without affecting my right to future care or treatment, and that the Practitioner or I may terminate the telemedicine visit at any time. I understand that I have the right to inspect all information obtained and/or recorded in the course of the telemedicine visit and may receive copies of available information for a reasonable fee.  I understand that some of the potential risks of receiving the Services via telemedicine include:  ?Delay or interruption in medical evaluation due to technological equipment failure or disruption; ?Information transmitted may not be sufficient (e.g. poor resolution of images) to allow for appropriate medical decision making by the Practitioner;  and/or  ?In rare instances, security protocols could fail, causing a breach of personal health information. ? ?Furthermore, I acknowledge that it is my responsibility to provide information about my medical history, conditions and care that is complete and accurate to the best of my ability. I acknowledge that Practitioner's advice, recommendations, and/or decision may be based on factors not within their control, such as incomplete or inaccurate data provided by me or distortions of diagnostic images or specimens that may result from electronic transmissions. I understand that the practice of medicine is not an exact science and that Practitioner makes no warranties or guarantees regarding treatment outcomes. I acknowledge that a copy of this consent can be made available to me via my patient portal ([facility]), or I can request a printed copy by calling the office of [facility].   ? ?I understand that my insurance will be billed for this visit.  ? ?I have read or had this consent read to me. ?I understand the contents of this consent, which adequately explains the benefits and risks of the Services being provided via telemedicine.  ?I have been provided ample opportunity to ask questions regarding this consent and the Services and have had my questions answered to my satisfaction. ?I give my informed consent for the services to be provided through the use of telemedicine in my medical care ? ? ? ?

## 2022-04-30 DIAGNOSIS — E1169 Type 2 diabetes mellitus with other specified complication: Secondary | ICD-10-CM | POA: Diagnosis not present

## 2022-04-30 DIAGNOSIS — E782 Mixed hyperlipidemia: Secondary | ICD-10-CM | POA: Diagnosis not present

## 2022-05-02 ENCOUNTER — Other Ambulatory Visit: Payer: Self-pay | Admitting: Student

## 2022-05-14 DIAGNOSIS — E041 Nontoxic single thyroid nodule: Secondary | ICD-10-CM | POA: Diagnosis not present

## 2022-05-14 DIAGNOSIS — I5033 Acute on chronic diastolic (congestive) heart failure: Secondary | ICD-10-CM | POA: Diagnosis not present

## 2022-05-14 DIAGNOSIS — R945 Abnormal results of liver function studies: Secondary | ICD-10-CM | POA: Diagnosis not present

## 2022-05-14 DIAGNOSIS — R000 Tachycardia, unspecified: Secondary | ICD-10-CM | POA: Diagnosis not present

## 2022-08-06 ENCOUNTER — Other Ambulatory Visit: Payer: Self-pay | Admitting: Student

## 2022-09-09 DIAGNOSIS — E041 Nontoxic single thyroid nodule: Secondary | ICD-10-CM | POA: Diagnosis not present

## 2022-09-09 DIAGNOSIS — E782 Mixed hyperlipidemia: Secondary | ICD-10-CM | POA: Diagnosis not present

## 2022-09-09 DIAGNOSIS — E1169 Type 2 diabetes mellitus with other specified complication: Secondary | ICD-10-CM | POA: Diagnosis not present

## 2022-09-11 DIAGNOSIS — M25561 Pain in right knee: Secondary | ICD-10-CM | POA: Diagnosis not present

## 2022-09-17 ENCOUNTER — Encounter: Payer: Self-pay | Admitting: Internal Medicine

## 2022-09-17 DIAGNOSIS — R000 Tachycardia, unspecified: Secondary | ICD-10-CM | POA: Diagnosis not present

## 2022-09-17 DIAGNOSIS — I5033 Acute on chronic diastolic (congestive) heart failure: Secondary | ICD-10-CM | POA: Diagnosis not present

## 2022-09-17 DIAGNOSIS — E041 Nontoxic single thyroid nodule: Secondary | ICD-10-CM | POA: Diagnosis not present

## 2022-09-17 DIAGNOSIS — R945 Abnormal results of liver function studies: Secondary | ICD-10-CM | POA: Diagnosis not present

## 2022-10-11 ENCOUNTER — Other Ambulatory Visit: Payer: Self-pay | Admitting: Student

## 2022-10-14 ENCOUNTER — Ambulatory Visit: Payer: BC Managed Care – PPO | Admitting: Student

## 2022-10-15 ENCOUNTER — Ambulatory Visit: Payer: BC Managed Care – PPO | Admitting: Student

## 2022-10-15 NOTE — Progress Notes (Deleted)
Cardiology Office Note    Date:  10/15/2022   ID:  SHRESHTA MEDLEY, DOB 1976-10-09, MRN [id number]  PCP:  Celene Squibb, MD  Cardiologist: Carlyle Dolly, MD    No chief complaint on file.   History of Present Illness:    Margaret [name] is a 46 y.o. female with past medical history of HFimpEF/NICM (EF 15-20% by echo in 12/2018 with cath showing normal cors and no evidence of myocarditis or infiltrating disease by cardiac MRI, EF at 45-50% by echo in 02/2020 and 50% in 03/2021), PVC's and SVT (s/p ablation in 12/2015) who presents to the office today for 24-monthfollow-up.  She most recently had a telehealth visit with myself in 03/2022 and denied any recent anginal symptoms at that time.  She was continued on her current cardiac medications including Coreg 6.25 mg in AM/12.5 mg in PM, Lasix 455mdaily and Spironolactone 12.5 mg daily. She previously had angioedema with Losartan and Entresto in the past and also been intolerant to SGLT2 inhibitor therapy which limited further titration of medical therapy.    Past Medical History:  Diagnosis Date   Diabetes mellitus without complication (HCC)    GERD (gastroesophageal reflux disease)    Hypercholesteremia    NICM (nonischemic cardiomyopathy) ([facility]   a. EF 15-20% by echo in 12/2018 with cath showing normal cors and no evidence of myocarditis or infiltrating disease by cardiac MRI b. EF at 45-50% by echo in 02/2020   Pneumonia 12/2021   SV[facility] ([facility]ricular tachycardia) ([facility]    Past [facility] History:  [place]cedure [place]ality Date   [id number]   COLONOSCOPY WITH PROPOFOL N/A 02/14/2022   Procedure: COLONOSCOPY WITH PROPOFOL;  Surgeon: CaHarvel QualeMD;  [facility];  Service: Gastroenterology;  Laterality: N/A;  145   ELECTROPHYSIOLOGIC STUDY N/A 01/09/2016   Procedure: SVT Ablation;  Surgeon: GrEvans LanceMD;  [facility];  Service:  Cardiovascular;  Laterality: N/A;   LAPAROSCOPIC CHOLECYSTECTOMY  1998   MICROLARYNGOSCOPY Left 04/26/2018   Procedure: MICROLARYNGOSCOPY WITH EXCISION OF VALLECULER MASS;  Surgeon: TeLeta BaptistMD;  [facility] Service: ENT;  Laterality: Left;   RIGHT/LEFT HEART CATH AND CORONARY ANGIOGRAPHY N/A 02/09/2019   Procedure: RIGHT/LEFT HEART CATH AND CORONARY ANGIOGRAPHY;  Surgeon: JoMartiniquePeter M, MD;  [facility];  Service: Cardiovascular;  Laterality: N/A;   SUPRAVENTRICULAR TACHYCARDIA ABLATION  01/09/2016   TUBAL LIGATION  2007    Current Medications: Outpatient Medications Prior to Visit  Medication Sig Dispense Refill   atorvastatin (LIPITOR) 10 MG tablet TAKE 1 TABLET BY MOUTH AT NIGHT ON MONDAY, WEDNESDAY AND FRIDAY FOR CHOLESTEROL     Blood Glucose Monitoring Suppl (CONTOUR NEXT MONITOR) w/Device KIT USE TO CHECK BLOOD SUGAR PER PACKAGE INSTRUCTIONS     carvedilol (COREG) 6.25 MG tablet TAKE 1 TABLET BY MOUTH IN THE  MORNING AND 2 TABLETS BY MOUTH  IN THE EVENING 270 tablet 3   CONTOUR NEXT TEST test strip 2 (two) times daily.     fluticasone (FLONASE) 50 MCG/ACT nasal spray Place 1 spray into both nostrils daily as needed for allergies.     furosemide (LASIX) 40 MG tablet TAKE 1 TABLET BY MOUTH DAILY.  MAY TAKE AN ADDITIONAL 1 TABLET  DAILY AS NEEDED FOR SHORTNESS OF BREATH OR SWELLING 180 tablet 3   Microlet Lancets MISC USE TO CHECK BLOOD SUGAR  TWICE A DAY     potassium chloride SA (KLOR-CON M) 20 MEQ tablet TAKE 1 TABLET BY MOUTH DAILY 90 tablet 3   RYBELSUS 14 MG TABS Take 14 mg by mouth every morning.     spironolactone (ALDACTONE) 25 MG tablet TAKE ONE-HALF TABLET BY  MOUTH DAILY 45 tablet 3   No facility-administered medications prior to visit.     Allergies:   Entresto [sacubitril-valsartan], Codeine, Losartan, and Sulfa antibiotics   Social History   Socioeconomic History   Marital status: Married    Spouse name: Not on file    Number of children: Not on file   Years of education: Not on file   Highest education level: Not on file  Occupational History   Not on file  Tobacco Use   Smoking status: Never   Smokeless tobacco: Never  Vaping Use   Vaping Use: Never used  Substance and Sexual Activity   Alcohol use: No   Drug use: No   Sexual activity: Yes    Birth control/protection: Surgical    Comment: BTL  Other Topics Concern   Not on file  Social History Narrative   Not on file   Social Determinants of Health   Financial Resource Strain: Not on file  Food Insecurity: Not on file  Transportation Needs: Not on file  Physical Activity: Not on file  Stress: Not on file  Social Connections: Not on file     Family History:  The patient's ***family history includes Heart failure in her father; Hypertension in her father and mother; Stroke in her father.   Review of Systems:    Please see the history of present illness.     All other systems reviewed and are otherwise negative except as noted above.   Physical Exam:    VS:  There were no vitals taken for this visit.   General: Well developed, well nourished,female appearing in no acute distress. Head: Normocephalic, atraumatic. Neck: No carotid bruits. JVD not elevated.  Lungs: Respirations regular and unlabored, without wheezes or rales.  Heart: ***Regular rate and rhythm. No S3 or S4.  No murmur, no rubs, or gallops appreciated. Abdomen: Appears non-distended. No obvious abdominal masses. Msk:  Strength and tone appear normal for age. No obvious joint deformities or effusions. Extremities: No clubbing or cyanosis. No edema.  Distal pedal pulses are 2+ bilaterally. Neuro: Alert and oriented X 3. Moves all extremities spontaneously. No focal deficits noted. Psych:  Responds to questions appropriately with a normal affect. Skin: No rashes or lesions noted  Wt Readings from Last 3 Encounters:  04/11/22 245 lb (111.1 kg)  02/14/22 247 lb (112  kg)  09/13/21 257 lb (116.6 kg)        Studies/Labs Reviewed:   EKG:  EKG is*** ordered today.  The ekg ordered today demonstrates ***  Recent Labs: 02/13/2022: BUN 13; Creatinine, Ser 0.73; Potassium 3.4; Sodium 137   Lipid Panel No results found for: "CHOL", "TRIG", "HDL", "CHOLHDL", "VLDL", "LDLCALC", "LDLDIRECT"  Additional studies/ records that were reviewed today include:   R/LHC: 01/2019 There is severe left ventricular systolic dysfunction. LV end diastolic pressure is normal. The left ventricular ejection fraction is less than 25% by visual estimate.   1. Normal coronary anatomy. Left dominant circulation 2. Severe global LV dysfunction. EF estimated at 15-20%.  3. Normal LV filling pressures 4. Normal right heart pressures 5. Preserved cardiac output. Index 2.85 L/min/m squared.  Echocardiogram: 03/2021 IMPRESSIONS     1. Left  ventricular ejection fraction, by estimation, is 50%. The left  ventricle has low normal function. Left ventricular endocardial border not  optimally defined to evaluate regional wall motion. Left ventricular  diastolic parameters are consistent  with Grade I diastolic dysfunction (impaired relaxation).   2. Right ventricular systolic function is normal. The right ventricular  size is normal. There is normal pulmonary artery systolic pressure.   3. The mitral valve is normal in structure. Trivial mitral valve  regurgitation. No evidence of mitral stenosis.   4. The aortic valve has an indeterminant number of cusps. Aortic valve  regurgitation is mild. No aortic stenosis is present.   5. The inferior vena cava is normal in size with greater than 50%  respiratory variability, suggesting right atrial pressure of 3 mmHg.   Assessment:    No diagnosis found.   Plan:   In order of problems listed above:  ***    Shared Decision Making/Informed Consent:   {Are you ordering a CV Procedure (e.g. stress test, cath, DCCV, TEE, etc)?    Press F2        :[id number]}    Medication Adjustments/Labs and Tests Ordered: Current medicines are reviewed at length with the patient today.  Concerns regarding medicines are outlined above.  Medication changes, Labs and Tests ordered today are listed in the Patient Instructions below. There are no Patient Instructions on file for this visit.   Signed, Erma Heritage, PA-C  10/15/2022 7:33 AM    [place] [address] [place],  [address] Phone: [phone] Fax: [phone]

## 2022-12-10 NOTE — Progress Notes (Unsigned)
Cardiology Office Note:    Date:  12/11/2022   ID:  Margaret [name], DOB 10/13/1976, MRN [id number]  PCP:  Celene Squibb, MD   [facility] Cardiologist:  Carlyle Dolly, MD     Referring MD: Celene Squibb, MD   CC: Here for 110-monthfollow-up appointment  History of Present Illness:    Margaret SIEGENTHALERis a 46y.o. female with a hx of the following:  T2DM HFimpEF/NICM SVT and PVC's Morbid Obesity   Patient is a 46year old female with past medical history as mentioned above.  In 2020 her EF was found to be 15 to 20% by echocardiogram and heart catheterization revealed normal coronary arteries without evidence of infiltrating disease or cardiac myocarditis as revealed by cardiac MRI.  The following year EF improved to 45 to 50% with most recent EF at 50% in 2022.  She also has a past medical history of PVCs and SVT, underwent ablation for SVT in 2017.   Last seen by BBernerd Pho PA-C on April 11, 2022.  Denied any chest pain or shortness of breath with routine activities, but did note DOE with walking trails at [employer]  Able to walk flat surfaces at work without symptoms.  Did admit to intermittent lower extremity edema and was taking 40 mg of Lasix daily.  Was previously not able to tolerate losartan and Entresto due to symptoms similar to angioedema, intolerant to SGLT2 inhibitor that caused diarrhea.  Was told to follow-up in 6 months.  Today she presents for 632-monthollow-up visit.  She states she is doing well today.  Denies any acute cardiac complaints or concerns.  Denies any chest pain, shortness of breath, blood pressure issues, syncope, presyncope, dizziness, orthopnea, PND, swelling, or significant weight changes, acute bleeding, or claudication.  Does notice some palpitations occasionally at night, not bothersome per her report.  Her father had an MI in his 6038s Says she thinks she is due for repeat echocardiogram.  Denies any other questions or  concerns today.   Past Medical History:  Diagnosis Date   Diabetes mellitus without complication (HCC)    GERD (gastroesophageal reflux disease)    Hypercholesteremia    NIC[place] ([place]ischemic cardiomyopathy) ([facility]   a. EF 15-20% by echo in 12/2018 with cath showing normal cors and no evidence of myocarditis or infiltrating disease by cardiac MRI b. EF at 45-50% by echo in 02/2020   Pneumonia 12/2021   SVT (supraventricular tachycardia)     Past Surgical History:  [place]cedure Laterality Date   [id number]   COLONOSCOPY WITH PROPOFOL N/A 02/14/2022   Procedure: COLONOSCOPY WITH PROPOFOL;  Surgeon: CaHarvel QualeMD;  [facility];  Service: Gastroenterology;  Laterality: N/A;  145   ELECTROPHYSIOLOGIC STUDY N/A 01/09/2016   Procedure: SVT Ablation;  Surgeon: GrEvans LanceMD;  [facility];  Service: Cardiovascular;  Laterality: N/A;   LAPAROSCOPIC CHOLECYSTECTOMY  1998   MICROLARYNGOSCOPY Left 04/26/2018   Procedure: MICROLARYNGOSCOPY WITH EXCISION OF VALLECULER MASS;  Surgeon: TeLeta BaptistMD;  [facility] Service: ENT;  Laterality: Left;   RIGHT/LEFT HEART CATH AND CORONARY ANGIOGRAPHY N/A 02/09/2019   Procedure: RIGHT/LEFT HEART CATH AND CORONARY ANGIOGRAPHY;  Surgeon: JoMartiniquePeter M, MD;  [facility];  Service: Cardiovascular;  Laterality: N/A;   SUPRAVENTRICULAR TACHYCARDIA ABLATION  01/09/2016   TUBAL LIGATION  2007  Current Medications: Current Meds  Medication Sig   atorvastatin (LIPITOR) 10 MG tablet TAKE 1 TABLET BY MOUTH AT NIGHT ON MONDAY, WEDNESDAY AND FRIDAY FOR CHOLESTEROL   Blood Glucose Monitoring Suppl (CONTOUR NEXT MONITOR) w/Device KIT USE TO CHECK BLOOD SUGAR PER PACKAGE INSTRUCTIONS   carvedilol (COREG) 6.25 MG tablet TAKE 1 TABLET BY MOUTH IN THE  MORNING AND 2 TABLETS BY MOUTH  IN THE EVENING   CONTOUR NEXT TEST test strip 2 (two) times daily.    fluticasone (FLONASE) 50 MCG/ACT nasal spray Place 1 spray into both nostrils daily as needed for allergies.   furosemide (LASIX) 40 MG tablet TAKE 1 TABLET BY MOUTH DAILY.  MAY TAKE AN ADDITIONAL 1 TABLET  DAILY AS NEEDED FOR SHORTNESS OF BREATH OR SWELLING   Microlet Lancets MISC USE TO CHECK BLOOD SUGAR TWICE A DAY   OZEMPIC, 0.25 OR 0.5 MG/DOSE, 2 MG/3ML SOPN Inject 0.5 mg into the skin once a week.   potassium chloride SA (KLOR-CON M) 20 MEQ tablet TAKE 1 TABLET BY MOUTH DAILY    spironolactone (ALDACTONE) 25 MG tablet TAKE ONE-HALF TABLET BY  MOUTH DAILY     Allergies:   Entresto [sacubitril-valsartan], Codeine, Losartan, and Sulfa antibiotics   Social History   Socioeconomic History   Marital status: Married    Spouse name: Not on file   Number of children: Not on file   Years of education: Not on file   Highest education level: Not on file  Occupational History   Not on file  Tobacco Use   Smoking status: Never   Smokeless tobacco: Never  Vaping Use   Vaping Use: Never used  Substance and Sexual Activity   Alcohol use: No   Drug use: No   Sexual activity: Yes    Birth control/protection: Surgical    Comment: BTL  Other Topics Concern   Not on file  Social History Narrative   Not on file   Social Determinants of Health   Financial Resource Strain: Not on file  Food Insecurity: Not on file  Transportation Needs: Not on file  Physical Activity: Not on file  Stress: Not on file  Social Connections: Not on file     Family History: The patient's family history includes Heart failure in her father; Hypertension in her father and mother; Stroke in her father. There is no history of Colon cancer.  ROS:   Review of Systems  Constitutional: Negative.   HENT: Negative.    Eyes: Negative.   Respiratory: Negative.    Cardiovascular:  Positive for palpitations. Negative for chest pain, orthopnea, claudication, leg swelling and PND.       See HPI.   Gastrointestinal:  Negative.   Genitourinary: Negative.   Musculoskeletal: Negative.   Skin: Negative.   Neurological: Negative.   Endo/Heme/Allergies: Negative.   Psychiatric/Behavioral: Negative.      Please see the history of present illness.    All other systems reviewed and are negative.  EKGs/Labs/Other Studies Reviewed:    The following studies were reviewed today:   EKG:  EKG is ordered today.  The ekg ordered today demonstrates Sinus rhythm, 90 bpm, LVH, with nonspecific ST and T wave abnormality, otherwise nothing acute.  2D echocardiogram on April 04, 2021:  1. Left ventricular ejection fraction, by estimation, is 50%. The left  ventricle has low normal function. Left ventricular endocardial border not  optimally defined to evaluate regional wall motion. Left ventricular  diastolic parameters are consistent  with Grade  I diastolic dysfunction (impaired relaxation).   2. Right ventricular systolic function is normal. The right ventricular  size is normal. There is normal pulmonary artery systolic pressure.   3. The mitral valve is normal in structure. Trivial mitral valve  regurgitation. No evidence of mitral stenosis.   4. The aortic valve has an indeterminant number of cusps. Aortic valve  regurgitation is mild. No aortic stenosis is present.   5. The inferior vena cava is normal in size with greater than 50%  respiratory variability, suggesting right atrial pressure of 3 mmHg.    48-hour Holter monitor on April 05, 2019: Min HR 57, Max HR 145, Avg HR 92 No supraventricular ectopy Occasional ventricular ectopy (5.6%), all as isolated PVCs and bigeminy/trigeminy No significant arrhythmias  Cardiac MRI on March 10, 2019: IMPRESSION: 1. Moderately dilated LV with EF 22%, diffuse hypokinesis with septal-lateral dyssynchrony.   2.  Normal RV size with mild systolic dysfunction, EF 82%.   3. No myocardial LGE, so no definitive evidence for prior MI, infiltrative disease, or  myocarditis.  Right and left heart cath on February 09, 2019: There is severe left ventricular systolic dysfunction. LV end diastolic pressure is normal. The left ventricular ejection fraction is less than 25% by visual estimate.   1. Normal coronary anatomy. Left dominant circulation 2. Severe global LV dysfunction. EF estimated at 15-20%.  3. Normal LV filling pressures 4. Normal right heart pressures 5. Preserved cardiac output. Index 2.85 L/min/m squared.    SVT ablation on January 09, 2016: 1. Sinus rhythm upon presentation.  2. 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 Successful radiofrequency modification of the slow AV nodal pathway  4. No inducible arrhythmias following ablation.  5. No early apparent complications.   Recent Labs: 02/13/2022: BUN 13; Creatinine, Ser 0.73; Potassium 3.4; Sodium 137  Recent Lipid Panel No results found for: "CHOL", "TRIG", "HDL", "CHOLHDL", "VLDL", "LDLCALC", "LDLDIRECT"   Risk Assessment/Calculations:        The 10-year ASCVD risk score (Arnett DK, et al., 2019) is: 1.7%   Values used to calculate the score:     Age: 26 years     Sex: Female     Is Non-Hispanic African American: No     Diabetic: Yes     Tobacco smoker: No     Systolic Blood Pressure: 956 mmHg     Is BP treated: Yes     HDL Cholesterol: 50 mg/dL     Total Cholesterol: 156 mg/dL    Physical Exam:    VS:  BP 118/80   Pulse 91   Ht _0  (1.6 m)   Wt 252 lb 6.4 oz (114.5 kg)   SpO2 96%   BMI 44.71 kg/m     Wt Readings from Last 3 Encounters:  12/11/22 252 lb 6.4 oz (114.5 kg)  04/11/22 245 lb (111.1 kg)  02/14/22 247 lb (112 kg)     OZH:YQMVH, 46 year old female in no acute distress HEENT: Normal NECK: No JVD; No carotid bruits CARDIAC: S1/S2, RRR, no murmurs, rubs, gallops; 2+ peripheral pulses throughout, strong equal  bilaterally RESPIRATORY:  Clear and diminished to auscultation without rales, wheezing or rhonchi  MUSCULOSKELETAL:  No edema; No deformity  SKIN: Warm and dry NEUROLOGIC:  Alert and oriented x 3 PSYCHIATRIC:  Normal affect   ASSESSMENT:    1. Heart failure with improved ejection fraction (HFimpEF) (  [place])   2. NICM (nonischemic cardiomyopathy) ([place])   3. PSVT (paroxysmal supraventricular tachycardia)   4. PVC (premature ventricular contraction)   5. Type 2 diabetes mellitus without complication, with long-term current use of insulin ([place])   6. Morbid obesity ([place])    PLAN:    In order of problems listed above:  Heart failure with improved ejection fraction, nonischemic cardiomyopathy Echo in 2020 revealed EF 15 to 20% with catheterization revealing normal coronary arteries and cardiac MRI did not reveal any infiltrative disease or evidence of myocarditis.  Most recent echocardiogram revealed improvement in EF at 50%, grade 1 DD.  She is due for repeat echocardiogram and we will arrange this. Euvolemic and well compensated on exam. Low sodium diet, fluid restriction <2L, and daily weights encouraged. Educated to contact our office for weight gain of 2 lbs overnight or 5 lbs in one week.  Continue carvedilol, Lasix, potassium, and spironolactone.  We will refill spironolactone upon her request today.  History of being unable to tolerate Entresto and losartan (angioedema type symptoms) as well as SGLT2 inhibitor (diarrhea). Heart healthy diet and regular cardiovascular exercise encouraged.   PVCs and SVT, status post ablation Does admit to some palpitations at night occasionally, not bothersome per her report.  Heart rate is 90 bpm on exam today.  I offered to increase her dose of carvedilol, and she politely declines at this time.  She stated she will continue to monitor her heart rate and blood pressure.  Continue current medication regimen. Heart healthy diet and regular cardiovascular  exercise encouraged.   Type 2 diabetes Recent A1c check with PCP at 6%.  Continue Ozempic. Heart healthy, diabetic diet and regular cardiovascular exercise encouraged. Continue to follow with PCP.  Morbid Obesity BMI today 44.71. Weight loss via diet and exercise encouraged. Discussed the impact being overweight would have on cardiovascular risk. Continue Ozempic. Heart healthy diet and regular cardiovascular exercise encouraged.      5.  Disposition: Follow-up with Dr. Carlyle Dolly in 4 to 6 months or sooner if anything changes.  Medication Adjustments/Labs and Tests Ordered: Current medicines are reviewed at length with the patient today.  Concerns regarding medicines are outlined above.  Orders Placed This Encounter  Procedures   EKG 12-Lead   ECHOCARDIOGRAM COMPLETE   Meds ordered this encounter  Medications   spironolactone (ALDACTONE) 25 MG tablet    Sig: Take 0.5 tablets (12.5 mg total) by mouth daily.    Dispense:  45 tablet    Refill:  3    Requesting 1 year supply    Patient Instructions  Medication Instructions:  Spironolactone refilled today  Continue all other medications.     Labwork: none  Testing/Procedures: Your physician has requested that you have an echocardiogram. Echocardiography is a painless test that uses sound waves to create images of your heart. It provides your doctor with information about the size and shape of your heart and how well your heart's chambers and valves are working. This procedure takes approximately one hour. There are no restrictions for this procedure. Please do NOT wear cologne, perfume, aftershave, or lotions (deodorant is allowed). Please arrive 15 minutes prior to your appointment time. Office will contact with results via phone, letter or mychart.     Follow-Up: 4-6 months - Dr. Harl Bowie   Any Other Special Instructions Will Be Listed Below (If Applicable).   If you need a refill on your cardiac medications before your  next appointment, please call your pharmacy.  Signed, Finis Bud, NP  12/11/2022 1:45 PM    [place]

## 2022-12-11 ENCOUNTER — Encounter: Payer: Self-pay | Admitting: Nurse Practitioner

## 2022-12-11 ENCOUNTER — Ambulatory Visit: Payer: BC Managed Care – PPO | Attending: Student | Admitting: Nurse Practitioner

## 2022-12-11 VITALS — BP 118/80 | HR 91 | Ht 63.0 in | Wt 252.4 lb

## 2022-12-11 DIAGNOSIS — I5022 Chronic systolic (congestive) heart failure: Secondary | ICD-10-CM | POA: Diagnosis not present

## 2022-12-11 DIAGNOSIS — I428 Other cardiomyopathies: Secondary | ICD-10-CM

## 2022-12-11 DIAGNOSIS — Z794 Long term (current) use of insulin: Secondary | ICD-10-CM

## 2022-12-11 DIAGNOSIS — I471 Supraventricular tachycardia, unspecified: Secondary | ICD-10-CM

## 2022-12-11 DIAGNOSIS — I493 Ventricular premature depolarization: Secondary | ICD-10-CM

## 2022-12-11 DIAGNOSIS — E119 Type 2 diabetes mellitus without complications: Secondary | ICD-10-CM

## 2022-12-11 MED ORDER — SPIRONOLACTONE 25 MG PO TABS: 12.5000 mg | ORAL_TABLET | Freq: Every day | ORAL | 3 refills | Status: DC

## 2022-12-11 NOTE — Patient Instructions (Signed)
Medication Instructions:  Spironolactone refilled today  Continue all other medications.     Labwork: none  Testing/Procedures: Your physician has requested that you have an echocardiogram. Echocardiography is a painless test that uses sound waves to create images of your heart. It provides your doctor with information about the size and shape of your heart and how well your heart's chambers and valves are working. This procedure takes approximately one hour. There are no restrictions for this procedure. Please do NOT wear cologne, perfume, aftershave, or lotions (deodorant is allowed). Please arrive 15 minutes prior to your appointment time. Office will contact with results via phone, letter or mychart.     Follow-Up: 4-6 months - Dr. Harl Bowie   Any Other Special Instructions Will Be Listed Below (If Applicable).   If you need a refill on your cardiac medications before your next appointment, please call your pharmacy.

## 2022-12-16 ENCOUNTER — Ambulatory Visit (HOSPITAL_COMMUNITY)
Admission: RE | Admit: 2022-12-16 | Discharge: 2022-12-16 | Disposition: A | Discharge status: Home / Self Care | Payer: BC Managed Care – PPO | Source: Ambulatory Visit | Attending: Family Medicine | Admitting: Family Medicine

## 2022-12-16 ENCOUNTER — Other Ambulatory Visit (HOSPITAL_COMMUNITY): Payer: Self-pay | Admitting: Family Medicine

## 2022-12-16 DIAGNOSIS — J069 Acute upper respiratory infection, unspecified: Secondary | ICD-10-CM

## 2022-12-17 ENCOUNTER — Other Ambulatory Visit: Payer: BC Managed Care – PPO

## 2023-01-13 DIAGNOSIS — E782 Mixed hyperlipidemia: Secondary | ICD-10-CM | POA: Diagnosis not present

## 2023-01-13 DIAGNOSIS — E041 Nontoxic single thyroid nodule: Secondary | ICD-10-CM | POA: Diagnosis not present

## 2023-01-13 DIAGNOSIS — E1169 Type 2 diabetes mellitus with other specified complication: Secondary | ICD-10-CM | POA: Diagnosis not present

## 2023-01-19 ENCOUNTER — Encounter: Payer: Self-pay | Admitting: Internal Medicine

## 2023-01-27 ENCOUNTER — Ambulatory Visit: Payer: BC Managed Care – PPO | Attending: Nurse Practitioner

## 2023-01-27 DIAGNOSIS — I5033 Acute on chronic diastolic (congestive) heart failure: Secondary | ICD-10-CM | POA: Diagnosis not present

## 2023-01-27 DIAGNOSIS — E1169 Type 2 diabetes mellitus with other specified complication: Secondary | ICD-10-CM | POA: Diagnosis not present

## 2023-01-27 DIAGNOSIS — I5022 Chronic systolic (congestive) heart failure: Secondary | ICD-10-CM

## 2023-01-27 DIAGNOSIS — R000 Tachycardia, unspecified: Secondary | ICD-10-CM | POA: Diagnosis not present

## 2023-01-27 DIAGNOSIS — I110 Hypertensive heart disease with heart failure: Secondary | ICD-10-CM | POA: Diagnosis not present

## 2023-01-27 DIAGNOSIS — E782 Mixed hyperlipidemia: Secondary | ICD-10-CM | POA: Diagnosis not present

## 2023-01-27 DIAGNOSIS — I509 Heart failure, unspecified: Secondary | ICD-10-CM | POA: Diagnosis not present

## 2023-01-28 LAB — ECHOCARDIOGRAM COMPLETE
AR max vel: 1.48 cm2
AV Area VTI: 1.33 cm2
AV Area mean vel: 1.33 cm2
AV Mean grad: 7.2 mmHg
AV Peak grad: 11.3 mmHg
Ao pk vel: 1.68 m/s
Area-P 1/2: 3.93 cm2
Calc EF: 48.6 %
Est EF: 50
MV M vel: 3.24 m/s
MV Peak grad: 41.9 mmHg
P 1/2 time: 1429 msec
S' Lateral: 4 cm
Single Plane A2C EF: 46.5 %
Single Plane A4C EF: 49.9 %

## 2023-04-14 ENCOUNTER — Other Ambulatory Visit: Payer: Self-pay | Admitting: Student

## 2023-05-04 ENCOUNTER — Ambulatory Visit: Payer: BC Managed Care – PPO | Admitting: Cardiology

## 2023-05-07 ENCOUNTER — Encounter: Payer: Self-pay | Admitting: Cardiology

## 2023-05-07 ENCOUNTER — Ambulatory Visit: Payer: BC Managed Care – PPO | Attending: Cardiology | Admitting: Cardiology

## 2023-05-07 VITALS — BP 120/78 | HR 84 | Ht 63.0 in | Wt 237.0 lb

## 2023-05-07 DIAGNOSIS — I5032 Chronic diastolic (congestive) heart failure: Secondary | ICD-10-CM | POA: Diagnosis not present

## 2023-05-07 DIAGNOSIS — E782 Mixed hyperlipidemia: Secondary | ICD-10-CM | POA: Diagnosis not present

## 2023-05-07 DIAGNOSIS — I493 Ventricular premature depolarization: Secondary | ICD-10-CM

## 2023-05-07 DIAGNOSIS — I471 Supraventricular tachycardia, unspecified: Secondary | ICD-10-CM | POA: Diagnosis not present

## 2023-05-07 DIAGNOSIS — I5020 Unspecified systolic (congestive) heart failure: Secondary | ICD-10-CM

## 2023-05-07 MED ORDER — ROSUVASTATIN CALCIUM 5 MG PO TABS: 5.0000 mg | ORAL_TABLET | ORAL | 1 refills | Status: DC

## 2023-05-07 NOTE — Patient Instructions (Signed)
Medication Instructions:  Your physician has recommended you make the following change in your medication:   -Stop Atorvastatin -Start Crestor 5 mg tablets on Monday, Wednesday, Friday.   *If you need a refill on your cardiac medications before your next appointment, please call your pharmacy*   Lab Work: None If you have labs (blood work) drawn today and your tests are completely normal, you will receive your results only by: MyChart Message (if you have MyChart) OR A paper copy in the mail If you have any lab test that is abnormal or we need to change your treatment, we will call you to review the results.   Testing/Procedures: None   Follow-Up: At [facility], you and your health needs are our priority.  As part of our continuing mission to provide you with exceptional heart care, we have created designated Provider Care Teams.  These Care Teams include your primary Cardiologist (physician) and Advanced Practice Providers (APPs -  Physician Assistants and Nurse Practitioners) who all work together to provide you with the care you need, when you need it.  We recommend signing up for the patient portal called "MyChart".  Sign up information is provided on this After Visit Summary.  MyChart is used to connect with patients for Virtual Visits (Telemedicine).  Patients are able to view lab/test results, encounter notes, upcoming appointments, etc.  Non-urgent messages can be sent to your provider as well.   To learn more about what you can do with MyChart, go to [email or web address].    Your next appointment:   6 month(s)  Provider:   Dina Rich, MD    Other Instructions

## 2023-05-07 NOTE — Progress Notes (Signed)
Clinical Summary Margaret [name] is a 47 y.o.female seen today for follow up of the following medical problems.    1. Chronic systolic HF - several month history of progressing SOB/DOE, cough, orthopnea. No significant LE edema. Has had some adbomdinal distension     - Jan 2020 echo ordered by pcp showed LVEF 15-20%, restricitive diastolic function, mild RV dysfunction.  - 01/2019 cath: no significant CAD. Mean PA 21, PCWP 11, CI 2.85 - cardiac MRI no definitive evidence of myocarditis or infiltrating disease  03/2019 holter: 5.6% PVC burden     - no EtOH, no drug use.  - Father with MI in early 68s, no other family history of heart disease. Maternal grandfather early 59s had MI. No significant FH of heart failure per her report.     06/2019 echo LVEF 35-40%  02/2020 echo LVEF 45-50%, normal diastolic function - during 02/2020 ER visit noted some lip swelling, entresto was stopped. Tried losartan but also had some lip swelling.  Jan 2024 LVEF 50%   - no SOB/DOE, no LE edema - compliant with meds      2. PSVT - s/p ablation Jan 2017 by Dr Ladona Ridgel -- no recent palpitaitons.      3. PVCs - noted on prior EKGs 03/2019 holter: 5.6% PVC burden   -no recent palpitations.      4.Hyperlipidemia - taking atorva 10mg  MWF - reports joint pains.  - stopped atorva about 1 month ago, symptoms resolved  5. DM2  - mild, A1c was 6.5 in 2022 - on ozempic, last A1c 5.9      Past Medical History:  Diagnosis Date   Diabetes mellitus without complication (HCC)    GERD (gastroesophageal reflux disease)    Hypercholesteremia    NICM (nonischemic cardiomyopathy) (HCC)    a. EF 15-20% by echo in 12/2018 with cath showing normal cors and no evidence of myocarditis or infiltrating disease by cardiac MRI b. EF at 45-50% by echo in 02/2020   Pneumonia 12/2021   SVT (supraventricular tachycardia)      Allergies  Allergen Reactions   Entresto [Sacubitril-Valsartan]     LIP SWELLING     Codeine Hives and Itching   Losartan Swelling    Facial swelling   Sulfa Antibiotics Hives     Current Outpatient Medications  Medication Sig Dispense Refill   atorvastatin (LIPITOR) 10 MG tablet TAKE 1 TABLET BY MOUTH AT NIGHT ON MONDAY, WEDNESDAY AND FRIDAY FOR CHOLESTEROL     Blood Glucose Monitoring Suppl (CONTOUR NEXT MONITOR) w/Device KIT USE TO CHECK BLOOD SUGAR PER PACKAGE INSTRUCTIONS     carvedilol (COREG) 6.25 MG tablet TAKE 1 TABLET BY MOUTH IN THE  MORNING AND 2 TABLETS BY MOUTH  IN THE EVENING 270 tablet 3   CONTOUR NEXT TEST test strip 2 (two) times daily.     fluticasone (FLONASE) 50 MCG/ACT nasal spray Place 1 spray into both nostrils daily as needed for allergies.     furosemide (LASIX) 40 MG tablet TAKE 1 TABLET BY MOUTH DAILY.  MAY TAKE AN ADDITIONAL 1 TABLET  DAILY AS NEEDED FOR SHORTNESS OF BREATH OR SWELLING 180 tablet 3   Microlet Lancets MISC USE TO CHECK BLOOD SUGAR TWICE A DAY     OZEMPIC, 0.25 OR 0.5 MG/DOSE, 2 MG/3ML SOPN Inject 0.5 mg into the skin once a week.     potassium chloride SA (KLOR-CON M) 20 MEQ tablet TAKE 1 TABLET BY MOUTH DAILY 90 tablet 3  spironolactone (ALDACTONE) 25 MG tablet TAKE ONE-HALF TABLET BY MOUTH  DAILY 45 tablet 3   No current facility-administered medications for this visit.     Past Surgical History:  Procedure Laterality Date   APPENDECTOMY  1981   CESAREAN SECTION  1999; 2007   COLONOSCOPY WITH PROPOFOL N/A 02/14/2022   Procedure: COLONOSCOPY WITH PROPOFOL;  Surgeon: Dolores Frame, MD;  [facility];  Service: Gastroenterology;  Laterality: N/A;  145   ELECTROPHYSIOLOGIC STUDY N/A 01/09/2016   Procedure: SVT Ablation;  Surgeon: Marinus Maw, MD;  Location: [facility] INVASIVE CV LAB;  Service: Cardiovascular;  Laterality: N/A;   LAPAROSCOPIC CHOLECYSTECTOMY  1998   MICROLARYNGOSCOPY Left 04/26/2018   Procedure: MICROLARYNGOSCOPY WITH EXCISION OF VALLECULER MASS;  Surgeon: Newman Pies, MD;  Location: [facility];  Service: ENT;  Laterality: Left;   RIGHT/LEFT HEART CATH AND CORONARY ANGIOGRAPHY N/A 02/09/2019   Procedure: RIGHT/LEFT HEART CATH AND CORONARY ANGIOGRAPHY;  Surgeon: Swaziland, Peter M, MD;  [facility]VAS[facility] CV LAB;  Service: Cardiovascular;  Laterality: N/A;   SUPRAVENTRICULAR TACHYCARDIA ABLATION  01/09/2016   TUBAL LIGATION  2007     Allergies  Allergen Reactions   Entresto [Sacubitril-Valsartan]     LIP SWELLING    Codeine Hives and Itching   Losartan Swelling    Facial swelling   Sulfa Antibiotics Hives      Family History  Problem Relation Age of Onset   Hypertension Mother    Heart failure Father    Hypertension Father    Stroke Father    Colon cancer Neg Hx      Social History Margaret [name] reports that she has never smoked. She has never used smokeless tobacco. Margaret [name] reports no history of alcohol use.   Review of Systems CONSTITUTIONAL: No weight loss, fever, chills, weakness or fatigue.  HEENT: Eyes: No visual loss, blurred vision, double vision or yellow sclerae.No hearing loss, sneezing, congestion, runny nose or sore throat.  SKIN: No rash or itching.  CARDIOVASCULAR: per hpi RESPIRATORY: No shortness of breath, cough or sputum.  GASTROINTESTINAL: No anorexia, nausea, vomiting or diarrhea. No abdominal pain or blood.  GENITOURINARY: No burning on urination, no polyuria NEUROLOGICAL: No headache, dizziness, syncope, paralysis, ataxia, numbness or tingling in the extremities. No change in bowel or bladder control.  MUSCULOSKELETAL: No muscle, back pain, joint pain or stiffness.  LYMPHATICS: No enlarged nodes. No history of splenectomy.  PSYCHIATRIC: No history of depression or anxiety.  ENDOCRINOLOGIC: No reports of sweating, cold or heat intolerance. No polyuria or polydipsia.  Marland Kitchen   Physical Examination Today's Vitals   05/07/23 0856  BP: 120/78  Pulse: 84  SpO2: 97%  Weight: 237 lb (107.5 kg)  Height: 5\' 3"  (1.6 m)    Body mass index is 41.98 kg/m.  Gen: resting comfortably, no acute distress HEENT: no scleral icterus, pupils equal round and reactive, no palptable cervical adenopathy,  CV: RRR, no m/rg no jvd Resp: Clear to auscultation bilaterally GI: abdomen is soft, non-tender, non-distended, normal bowel sounds, no hepatosplenomegaly MSK: extremities are warm, no edema.  Skin: warm, no rash Neuro:  no focal deficits Psych: appropriate affect   Diagnostic Studies  Jan 2020 echo IMPRESSIONS      1. The left ventricle has severely reduced systolic function, LVEF 15-20%. The cavity size is severely increased. There is normal left ventricular hypertrophy. Echo evidence of restrictive diastolic filling patterns. Elevated left atrial and left  ventricular end-diastolic pressures.  2. Severe  diffuse hypokinesis.  3. The right ventricle is in size. There is mildly reduced systolic function. Right ventricular systolic pressure is mildly elevated with an estimated pressure of 28.7 mmHg.  4. Severely dilated left atrial size.  5. The mitral valve normal in structure. Regurgitation is mild by color flow Doppler.  6. Tricuspid regurgitation is mild.  7. The aortic valve tricuspid. Aortic valve regurgitation is mild by color flow Doppler.     01/2019 cath There is severe left ventricular systolic dysfunction. LV end diastolic pressure is normal. The left ventricular ejection fraction is less than 25% by visual estimate.   1. Normal coronary anatomy. Left dominant circulation 2. Severe global LV dysfunction. EF estimated at 15-20%.  3. Normal LV filling pressures 4. Normal right heart pressures 5. Preserved cardiac output. Index 2.85 L/min/m squared.     03/2019 holter Min HR 57, Max HR 145, Avg HR 92 No supraventricular ectopy Occasional ventricular ectopy (5.6%), all as isolated PVCs and bigeminy/trigeminy No significant arrhythmias       06/2019 echo IMPRESSIONS      1. The left  ventricle has moderately reduced systolic function, with an ejection fraction of 35-40%. The cavity size was moderate to severely dilated. Left ventricular diastolic Doppler parameters are consistent with impaired relaxation. Left  ventricular diffuse hypokinesis.  2. The mitral valve is grossly normal. Mild thickening of the mitral valve leaflet.  3. The tricuspid valve is grossly normal.  4. The aortic valve is tricuspid. Aortic valve regurgitation is mild by color flow Doppler.  5. The aortic root is normal in size and structure.     02/2020 echo IMPRESSIONS     1. Left ventricular ejection fraction, by estimation, is 45 to 50%. The  left ventricle has mildly decreased function. The left ventricle has no  regional wall motion abnormalities. Left ventricular diastolic parameters  were normal.   2. Right ventricular systolic function is normal. The right ventricular  size is normal. There is normal pulmonary artery systolic pressure.   3. The mitral valve is normal in structure and function. Trivial mitral  valve regurgitation. No evidence of mitral stenosis.   4. The aortic valve has an indeterminant number of cusps. Aortic valve  regurgitation is mild. No aortic stenosis is present.   5. The inferior vena cava is normal in size with greater than 50%  respiratory variability, suggesting right atrial pressure of 3 mmHg.   FINDINGS   Left Ventricle: Left ventricular ejection fraction, by estimation, is 45  to 50%. The left ventricle has mildly decreased function. The left  ventricle has no regional wall motion abnormalities. The left ventricular  internal cavity size was normal in  size. There is no left ventricular hypertrophy. Left ventricular diastolic  parameters were normal.   Right Ventricle: The right ventricular size is normal. No increase in  right ventricular wall thickness. Right ventricular systolic function is  normal. There is normal pulmonary artery systolic pressure.  The tricuspid  regurgitant velocity is 1.92 m/s, and   with an assumed right atrial pressure of 10 mmHg, the estimated right  ventricular systolic pressure is 24.7 mmHg.    Jan 2024 echo 1. Left ventricular ejection fraction, by estimation, is 50%. The left  ventricle has low normal function. The left ventricle has no regional wall  motion abnormalities. Left ventricular diastolic parameters are consistent  with Grade I diastolic  dysfunction (impaired relaxation). The average left ventricular global  longitudinal strain is -18.5 %. The global longitudinal  strain is normal.   2. Right ventricular systolic function is normal. The right ventricular  size is normal. Tricuspid regurgitation signal is inadequate for assessing  PA pressure.   3. The mitral valve is normal in structure. Trivial mitral valve  regurgitation. No evidence of mitral stenosis.   4. The tricuspid valve is abnormal.   5. The aortic valve is tricuspid. There is mild calcification of the  aortic valve. There is mild thickening of the aortic valve. Aortic valve  regurgitation is trivial. No aortic stenosis is present.   6. The inferior vena cava is normal in size with greater than 50%  respiratory variability, suggesting right atrial pressure of 3 mmHg.   Assessment and Plan   1. HFimpEF - no clear etiology by cath, MRI, or holter - most recent echo LVEF 50% - side effects on higher doses of coreg, tolerates 6.25mg  bid. She is on aldactoe - lip swelling on entresto and losartan.  - no symptoms, continue current meds     2. PSVT -no recent symptoms, continue to monitor   3. PVCs -no symptoms, continue coreg  4. Hyperlipidemia - muscle aches on atorva 10mg  MWF - will try crestor 5mg  MWF     Antoine Poche, M.D.

## 2023-05-19 ENCOUNTER — Encounter: Payer: Self-pay | Admitting: Emergency Medicine

## 2023-05-19 ENCOUNTER — Ambulatory Visit
Admission: EM | Admit: 2023-05-19 | Discharge: 2023-05-19 | Disposition: A | Discharge status: Home / Self Care | Payer: BC Managed Care – PPO | Attending: Nurse Practitioner | Admitting: Nurse Practitioner

## 2023-05-19 DIAGNOSIS — N3000 Acute cystitis without hematuria: Secondary | ICD-10-CM

## 2023-05-19 DIAGNOSIS — E1169 Type 2 diabetes mellitus with other specified complication: Secondary | ICD-10-CM | POA: Diagnosis not present

## 2023-05-19 DIAGNOSIS — E041 Nontoxic single thyroid nodule: Secondary | ICD-10-CM | POA: Diagnosis not present

## 2023-05-19 DIAGNOSIS — E782 Mixed hyperlipidemia: Secondary | ICD-10-CM | POA: Diagnosis not present

## 2023-05-19 LAB — POCT URINALYSIS DIP (MANUAL ENTRY)
Glucose, UA: NEGATIVE mg/dL
Ketones, POC UA: NEGATIVE mg/dL
Nitrite, UA: POSITIVE — AB
Spec Grav, UA: 1.02 (ref 1.010–1.025)
Urobilinogen, UA: 1 E.U./dL
pH, UA: 5.5 (ref 5.0–8.0)

## 2023-05-19 MED ORDER — NITROFURANTOIN MONOHYD MACRO 100 MG PO CAPS: 100.0000 mg | ORAL_CAPSULE | Freq: Two times a day (BID) | ORAL | 0 refills | Status: AC

## 2023-05-19 NOTE — Discharge Instructions (Signed)
-  Your urinalysis shows that you have a urinary tract infection.  A urine culture has been ordered to ensure you are being treated with the correct antibiotic.  If the medication needs to be changed, you will be contacted. -Take medications as prescribed. -Increase fluids. -Ibuprofen or Tylenol for pain, fever, or general discomfort. -Develop a toileting schedule that will allow you to toilet at least every 2 hours. -Avoid caffeine to include tea, soda, and coffee. -If sexually active, void at least 15 to 20 minutes after sexual intercourse. -Follow-up in the emergency department if you develop fever, chills, worsening abdominal pain, worsening symptoms or other concerns.  -If symptoms do not improve, please follow-up in this clinic or with your primary care physician for further evaluation. -Follow-up as needed.

## 2023-05-19 NOTE — ED Provider Notes (Signed)
[facility]    CSN: [id number] Arrival date & time: 05/19/23  7829      History   Chief Complaint No chief complaint on file.   HPI Margaret [name] is a 47 y.o. female.   The history is provided by the patient.   The patient presents for complaints of lower abdominal pain.  Patient states symptoms started approximately 2 days ago.  She states initially she had headache, fatigue, and chills.  She states the headache and fatigue have since improved.  She states now her stomach feels "sensitive" she states she feels like there is a "pressure that is pulling".  Patient denies urinary frequency, urgency, hesitancy, dysuria, hematuria, low back pain, flank pain, or vaginal symptoms.  Patient denies history of recurrent urinary tract infections.    Past Medical History:  Diagnosis Date   Diabetes mellitus without complication (HCC)    GERD (gastroesophageal reflux disease)    Hypercholesteremia    NICM (nonischemic cardiomyopathy) (HCC)    a. EF 15-20% by echo in 12/2018 with cath showing normal cors and no evidence of myocarditis or infiltrating disease by cardiac MRI b. EF at 45-50% by echo in 02/2020   Pneumonia 12/2021   SVT (supraventricular tachycardia)     Patient Active Problem List   Diagnosis Date Noted   Acute systolic CHF (congestive heart failure) (HCC) 02/09/2019   SVT (supraventricular tachycardia) 01/09/2016   PSVT (paroxysmal supraventricular tachycardia) (HCC) 05/14/2015    Past Surgical History:  Procedure Laterality Date   APPENDECTOMY  1981   CESAREAN SECTION  1999; 2007   COLONOSCOPY WITH PROPOFOL N/A 02/14/2022   Procedure: COLONOSCOPY WITH PROPOFOL;  Surgeon: Dolores Frame, MD;  [facility];  Service: Gastroenterology;  Laterality: N/A;  145   ELECTROPHYSIOLOGIC STUDY N/A 01/09/2016   Procedure: SVT Ablation;  Surgeon: Marinus Maw, MD;  Location: [facility] INVASIVE CV LAB;  Service: Cardiovascular;  Laterality: N/A;    LAPAROSCOPIC CHOLECYSTECTOMY  1998   MICROLARYNGOSCOPY Left 04/26/2018   Procedure: MICROLARYNGOSCOPY WITH EXCISION OF VALLECULER MASS;  Surgeon: Newman Pies, MD;  Location: [facility];  Service: ENT;  Laterality: Left;   RIGHT/LEFT HEART CATH AND CORONARY ANGIOGRAPHY N/A 02/09/2019   Procedure: RIGHT/LEFT HEART CATH AND CORONARY ANGIOGRAPHY;  Surgeon: Swaziland, Peter M, MD;  Location: [facility] INVASIVE CV LAB;  Service: Cardiovascular;  Laterality: N/A;   SUPRAVENTRICULAR TACHYCARDIA ABLATION  01/09/2016   TUBAL LIGATION  2007    OB History     Gravida  2   Para  2   Term  2   Preterm      AB      Living         SAB      IAB      Ectopic      Multiple      Live Births               Home Medications    Prior to Admission medications   Medication Sig Start Date End Date Taking? Authorizing Provider  nitrofurantoin, macrocrystal-monohydrate, (MACROBID) 100 MG capsule Take 1 capsule (100 mg total) by mouth 2 (two) times daily. 05/19/23  Yes Modupe Shampine-Warren, Sadie Haber, NP  Blood Glucose Monitoring Suppl (CONTOUR NEXT MONITOR) w/Device KIT USE TO CHECK BLOOD SUGAR PER PACKAGE INSTRUCTIONS 11/06/20   [provider]  carvedilol (COREG) 6.25 MG tablet TAKE 1 TABLET BY MOUTH IN THE  MORNING AND 2 TABLETS BY MOUTH  IN THE EVENING  10/13/22   Strader, Lennart Pall, PA-C  CONTOUR NEXT TEST test strip 2 (two) times daily. 03/12/21   [provider]  fluticasone (FLONASE) 50 MCG/ACT nasal spray Place 1 spray into both nostrils daily as needed for allergies. 10/31/21   [provider]  furosemide (LASIX) 40 MG tablet TAKE 1 TABLET BY MOUTH DAILY.  MAY TAKE AN ADDITIONAL 1 TABLET  DAILY AS NEEDED FOR SHORTNESS OF BREATH OR SWELLING 10/13/22   [place] Ouch, Lennart Pall, PA-C  Microlet Lancets MISC USE TO CHECK BLOOD SUGAR TWICE A DAY 03/12/21   [provider]  OZEMPIC, 0.25 OR 0.5 MG/DOSE, 2 MG/3ML SOPN Inject 0.5 mg into the skin once a week. 11/17/22    [provider]  potassium chloride SA (KLOR-CON M) 20 MEQ tablet TAKE 1 TABLET BY MOUTH DAILY 10/13/22   Iran Ouch, [place] M, PA-C  rosuvastatin (CRESTOR) 5 MG tablet Take 1 tablet (5 mg total) by mouth every Monday, Wednesday, and Friday. 05/08/23 08/06/23  Antoine Poche, MD  spironolactone (ALDACTONE) 25 MG tablet TAKE ONE-HALF TABLET BY MOUTH  DAILY 04/15/23   Sharlene Dory, NP    Family History Family History  Problem Relation Age of Onset   Hypertension Mother    Heart failure Father    Hypertension Father    Stroke Father    Colon cancer Neg Hx     Social History Social History   Tobacco Use   Smoking status: Never   Smokeless tobacco: Never  Vaping Use   Vaping Use: Never used  Substance Use Topics   Alcohol use: No   Drug use: No     Allergies   Entresto [sacubitril-valsartan], Codeine, Losartan, and Sulfa antibiotics   Review of Systems Review of Systems Per HPI  Physical Exam Triage Vital Signs ED Triage Vitals  Enc Vitals Group     BP 05/19/23 0843 105/72     Pulse Rate 05/19/23 0843 89     Resp 05/19/23 0843 18     Temp 05/19/23 0843 98.6 F (37 C)     Temp Source 05/19/23 0843 Oral     SpO2 05/19/23 0843 96 %     Weight --      Height --      Head Circumference --      Peak Flow --      Pain Score 05/19/23 0845 6     Pain Loc --      Pain Edu? --      Excl. in GC? --    No data found.  Updated Vital Signs BP 105/72 (BP Location: Right Arm)   Pulse 89   Temp 98.6 F (37 C) (Oral)   Resp 18   LMP 11/26/2018 (Approximate)   SpO2 96%   Visual Acuity Right Eye Distance:   Left Eye Distance:   Bilateral Distance:    Right Eye Near:   Left Eye Near:    Bilateral Near:     Physical Exam Vitals and nursing note reviewed.  Constitutional:      General: She is not in acute distress.    Appearance: Normal appearance.  HENT:     Head: Normocephalic.  Eyes:     Extraocular Movements: Extraocular movements intact.      Conjunctiva/sclera: Conjunctivae normal.     Pupils: Pupils are equal, round, and reactive to light.  Cardiovascular:     Rate and Rhythm: Normal rate and regular rhythm.     Pulses: Normal pulses.  Heart sounds: Normal heart sounds.  Pulmonary:     Effort: Pulmonary effort is normal.     Breath sounds: Normal breath sounds.  Abdominal:     General: Bowel sounds are normal.     Palpations: Abdomen is soft.     Tenderness: There is abdominal tenderness in the left lower quadrant.  Musculoskeletal:     Cervical back: Normal range of motion.  Lymphadenopathy:     Cervical: No cervical adenopathy.  Skin:    General: Skin is warm and dry.  Neurological:     General: No focal deficit present.     Mental Status: She is alert and oriented to person, place, and time.  Psychiatric:        Mood and Affect: Mood normal.        Behavior: Behavior normal.      UC Treatments / Results  Labs (all labs ordered are listed, but only abnormal results are displayed) Labs Reviewed  POCT URINALYSIS DIP (MANUAL ENTRY) - Abnormal; Notable for the following components:      Result Value   Clarity, UA hazy (*)    Bilirubin, UA small (*)    Blood, UA moderate (*)    Protein Ur, POC trace (*)    Nitrite, UA Positive (*)    Leukocytes, UA Small (1+) (*)    All other components within normal limits  URINE CULTURE    EKG   Radiology No results found.  Procedures Procedures (including critical care time)  Medications Ordered in UC Medications - No data to display  Initial Impression / Assessment and Plan / UC Course  I have reviewed the triage vital signs and the nursing notes.  Pertinent labs & imaging results that were available during my care of the patient were reviewed by me and considered in my medical decision making (see chart for details).  The patient is well-appearing, she is in no acute distress, vital signs are stable.  Urinalysis is positive for nitrates, leukocytes,  and blood.  Will treat for acute urinary tract infection with Macrobid 100 mg twice daily for the next 5 days.  Urine culture is pending.  Patient was given supportive care recommendations to include increasing her fluid intake as symptoms persist, over-the-counter analgesics for pain or discomfort, and allowing for plenty of rest.  Patient was given strict ER follow-up precautions.  Patient is in agreement with this plan of care and verbalizes understanding.  All questions were answered.  Patient stable for discharge.   Final Clinical Impressions(s) / UC Diagnoses   Final diagnoses:  Acute cystitis without hematuria     Discharge Instructions      -Your urinalysis shows that you have a urinary tract infection.  A urine culture has been ordered to ensure you are being treated with the correct antibiotic.  If the medication needs to be changed, you will be contacted. -Take medications as prescribed. -Increase fluids. -Ibuprofen or Tylenol for pain, fever, or general discomfort. -Develop a toileting schedule that will allow you to toilet at least every 2 hours. -Avoid caffeine to include tea, soda, and coffee. -If sexually active, void at least 15 to 20 minutes after sexual intercourse. -Follow-up in the emergency department if you develop fever, chills, worsening abdominal pain, worsening symptoms or other concerns.  -If symptoms do not improve, please follow-up in this clinic or with your primary care physician for further evaluation. -Follow-up as needed.     ED Prescriptions     Medication Sig  Dispense Auth. Provider   nitrofurantoin, macrocrystal-monohydrate, (MACROBID) 100 MG capsule Take 1 capsule (100 mg total) by mouth 2 (two) times daily. 10 capsule Aerionna Moravek-Warren, Sadie Haber, NP      PDMP not reviewed this encounter.   Abran Cantor, NP 05/19/23 [phone]

## 2023-05-19 NOTE — ED Triage Notes (Signed)
ABD pain since Sunday.  Headache and chills on Monday that went away.  States stomach fills sensitive.  C/o lower ABD pain.

## 2023-05-20 ENCOUNTER — Other Ambulatory Visit: Payer: Self-pay

## 2023-05-20 ENCOUNTER — Emergency Department (HOSPITAL_COMMUNITY)
Admission: EM | Admit: 2023-05-20 | Discharge: 2023-05-20 | Disposition: A | Discharge status: Home / Self Care | Payer: BC Managed Care – PPO | Attending: Emergency Medicine | Admitting: Emergency Medicine

## 2023-05-20 ENCOUNTER — Encounter (HOSPITAL_COMMUNITY): Payer: Self-pay | Admitting: *Deleted

## 2023-05-20 ENCOUNTER — Emergency Department (HOSPITAL_COMMUNITY): Payer: BC Managed Care – PPO

## 2023-05-20 DIAGNOSIS — Z79899 Other long term (current) drug therapy: Secondary | ICD-10-CM | POA: Diagnosis not present

## 2023-05-20 DIAGNOSIS — I10 Essential (primary) hypertension: Secondary | ICD-10-CM | POA: Diagnosis not present

## 2023-05-20 DIAGNOSIS — K5792 Diverticulitis of intestine, part unspecified, without perforation or abscess without bleeding: Secondary | ICD-10-CM | POA: Diagnosis not present

## 2023-05-20 DIAGNOSIS — K5732 Diverticulitis of large intestine without perforation or abscess without bleeding: Secondary | ICD-10-CM | POA: Insufficient documentation

## 2023-05-20 DIAGNOSIS — R109 Unspecified abdominal pain: Secondary | ICD-10-CM | POA: Diagnosis not present

## 2023-05-20 DIAGNOSIS — R1032 Left lower quadrant pain: Secondary | ICD-10-CM | POA: Diagnosis not present

## 2023-05-20 LAB — COMPREHENSIVE METABOLIC PANEL
ALT: 17 U/L (ref 0–44)
AST: 11 U/L — ABNORMAL LOW (ref 15–41)
Albumin: 3.7 g/dL (ref 3.5–5.0)
Alkaline Phosphatase: 106 U/L (ref 38–126)
Anion gap: 9 (ref 5–15)
BUN: 15 mg/dL (ref 6–20)
CO2: 25 mmol/L (ref 22–32)
Calcium: 8.9 mg/dL (ref 8.9–10.3)
Chloride: 103 mmol/L (ref 98–111)
Creatinine, Ser: 0.65 mg/dL (ref 0.44–1.00)
GFR, Estimated: >60 mL/min (ref >60)
Glucose, Bld: 122 mg/dL — ABNORMAL HIGH (ref 70–99)
Potassium: 3.9 mmol/L (ref 3.5–5.1)
Sodium: 137 mmol/L (ref 135–145)
Total Bilirubin: 0.5 mg/dL (ref 0.3–1.2)
Total Protein: 7.6 g/dL (ref 6.5–8.1)

## 2023-05-20 LAB — CBC WITH DIFFERENTIAL/PLATELET
Abs Immature Granulocytes: 0.03 10*3/uL (ref 0.00–0.07)
Basophils Absolute: 0 10*3/uL (ref 0.0–0.1)
Basophils Relative: 0 %
Eosinophils Absolute: 0.1 10*3/uL (ref 0.0–0.5)
Eosinophils Relative: 1 %
HCT: 39.7 % (ref 36.0–46.0)
Hemoglobin: 12.6 g/dL (ref 12.0–15.0)
Immature Granulocytes: 0 %
Lymphocytes Relative: 13 %
Lymphs Abs: 1.2 10*3/uL (ref 0.7–4.0)
MCH: 28.1 pg (ref 26.0–34.0)
MCHC: 31.7 g/dL (ref 30.0–36.0)
MCV: 88.4 fL (ref 80.0–100.0)
Monocytes Absolute: 0.7 10*3/uL (ref 0.1–1.0)
Monocytes Relative: 7 %
Neutro Abs: 7.1 10*3/uL (ref 1.7–7.7)
Neutrophils Relative %: 79 %
Platelets: 195 10*3/uL (ref 150–400)
RBC: 4.49 MIL/uL (ref 3.87–5.11)
RDW: 14 % (ref 11.5–15.5)
WBC: 9.1 10*3/uL (ref 4.0–10.5)
nRBC: 0 % (ref 0.0–0.2)

## 2023-05-20 LAB — URINALYSIS, ROUTINE W REFLEX MICROSCOPIC
Bilirubin Urine: NEGATIVE
Glucose, UA: NEGATIVE mg/dL
Ketones, ur: NEGATIVE mg/dL
Leukocytes,Ua: NEGATIVE
Nitrite: NEGATIVE
Protein, ur: NEGATIVE mg/dL
Specific Gravity, Urine: 1.026 (ref 1.005–1.030)
pH: 5 (ref 5.0–8.0)

## 2023-05-20 LAB — URINE CULTURE: Culture: NO GROWTH

## 2023-05-20 LAB — I-STAT BETA HCG BLOOD, ED (MC, WL, AP ONLY): I-stat hCG, quantitative: <5 m[IU]/mL (ref <5)

## 2023-05-20 LAB — HCG, QUANTITATIVE, PREGNANCY: hCG, Beta Chain, Quant, S: 3 m[IU]/mL (ref <5)

## 2023-05-20 MED ORDER — HYDROCODONE-ACETAMINOPHEN 5-325 MG PO TABS: ORAL_TABLET | ORAL | 0 refills | Status: DC

## 2023-05-20 MED ORDER — METRONIDAZOLE 500 MG PO TABS: 500.0000 mg | ORAL_TABLET | Freq: Four times a day (QID) | ORAL | 0 refills | Status: AC

## 2023-05-20 MED ORDER — ONDANSETRON 4 MG PO TBDP: ORAL_TABLET | ORAL | 0 refills | Status: AC

## 2023-05-20 MED ORDER — HYDROCODONE-ACETAMINOPHEN 5-325 MG PO TABS: ORAL_TABLET | ORAL | 0 refills | Status: AC

## 2023-05-20 MED ORDER — IOHEXOL 300 MG/ML  SOLN
100.0000 mL | Freq: Once | INTRAMUSCULAR | Status: AC | PRN
  Administered 2023-05-20: 100 mL via INTRAVENOUS

## 2023-05-20 MED ORDER — CIPROFLOXACIN HCL 500 MG PO TABS: 500.0000 mg | ORAL_TABLET | Freq: Two times a day (BID) | ORAL | 0 refills | Status: DC

## 2023-05-20 NOTE — ED Triage Notes (Signed)
Pt in c/o lower suprapubic pain onset x 3 days, pt went to UC and was diagnosed with a UTI yeserday, pt c/o significant pain and came here for further eval, pt denies dysuria, hematuria, and urinary frequency, pt A&O x4

## 2023-05-20 NOTE — ED Provider Notes (Signed)
[place] EMERGENCY DEPARTMENT AT [facility] Provider Note   CSN: [id number] Arrival date & time: 05/20/23  4098     History {Add pertinent medical, surgical, social history, OB history to HPI:1} Chief Complaint  Patient presents with   Abdominal Pain    Margaret [name] is a 47 y.o. female.  Patient complains of lower abdominal pain.  Patient has a history of obesity and hypertension.   Abdominal Pain      Home Medications Prior to Admission medications   Medication Sig Start Date End Date Taking? Authorizing Provider  ciprofloxacin (CIPRO) 500 MG tablet Take 1 tablet (500 mg total) by mouth 2 (two) times daily. One po bid x 7 days 05/20/23  Yes Bethann Berkshire, MD  metroNIDAZOLE (FLAGYL) 500 MG tablet Take 1 tablet (500 mg total) by mouth 4 (four) times daily. 05/20/23  Yes Bethann Berkshire, MD  ondansetron (ZOFRAN-ODT) 4 MG disintegrating tablet 4mg  ODT q4 hours prn nausea/vomit 05/20/23  Yes Bethann Berkshire, MD  Blood Glucose Monitoring Suppl (CONTOUR NEXT MONITOR) w/Device KIT USE TO CHECK BLOOD SUGAR PER PACKAGE INSTRUCTIONS 11/06/20   [provider]  carvedilol (COREG) 6.25 MG tablet TAKE 1 TABLET BY MOUTH IN THE  MORNING AND 2 TABLETS BY MOUTH  IN THE EVENING 10/13/22   Strader, [place] M, PA-C  CONTOUR NEXT TEST test strip 2 (two) times daily. 03/12/21   [provider]  fluticasone (FLONASE) 50 MCG/ACT nasal spray Place 1 spray into both nostrils daily as needed for allergies. 10/31/21   [provider]  furosemide (LASIX) 40 MG tablet TAKE 1 TABLET BY MOUTH DAILY.  MAY TAKE AN ADDITIONAL 1 TABLET  DAILY AS NEEDED FOR SHORTNESS OF BREATH OR SWELLING 10/13/22   [place] Ouch, Lennart Pall, PA-C  HYDROcodone-acetaminophen (NORCO/VICODIN) 5-325 MG tablet Take 1 every 6 hours as needed for pain Relieved by Tylenol or Motrin alone 05/20/23   Bethann Berkshire, MD  Microlet Lancets MISC USE TO CHECK BLOOD SUGAR TWICE A DAY 03/12/21   [provider]   nitrofurantoin, macrocrystal-monohydrate, (MACROBID) 100 MG capsule Take 1 capsule (100 mg total) by mouth 2 (two) times daily. 05/19/23   Leath-Warren, Sadie Haber, NP  OZEMPIC, 0.25 OR 0.5 MG/DOSE, 2 MG/3ML SOPN Inject 0.5 mg into the skin once a week. 11/17/22   [provider]  potassium chloride SA (KLOR-CON M) 20 MEQ tablet TAKE 1 TABLET BY MOUTH DAILY 10/13/22   [place] Ouch, [place] M, PA-C  rosuvastatin (CRESTOR) 5 MG tablet Take 1 tablet (5 mg total) by mouth every Monday, Wednesday, and Friday. 05/08/23 08/06/23  Antoine Poche, MD  spironolactone (ALDACTONE) 25 MG tablet TAKE ONE-HALF TABLET BY MOUTH  DAILY 04/15/23   Sharlene Dory, NP      Allergies    Sherryll Burger [sacubitril-valsartan], Codeine, Losartan, and Sulfa antibiotics    Review of Systems   Review of Systems  Gastrointestinal:  Positive for abdominal pain.    Physical Exam Updated Vital Signs BP (!) 132/91 (BP Location: Right Arm)   Pulse 89   Temp 98 F (36.7 C) (Oral)   Resp 18   Ht 5\' 3"  (1.6 m)   Wt 104.8 kg   LMP 11/26/2018 (Approximate)   SpO2 98%   BMI 40.92 kg/m  Physical Exam  ED Results / Procedures / Treatments   Labs (all labs ordered are listed, but only abnormal results are displayed) Labs Reviewed  COMPREHENSIVE METABOLIC PANEL - Abnormal; Notable for the following components:      Result  Value   Glucose, Bld 122 (*)    AST 11 (*)    All other components within normal limits  URINALYSIS, ROUTINE W REFLEX MICROSCOPIC - Abnormal; Notable for the following components:   APPearance HAZY (*)    Hgb urine dipstick SMALL (*)    Bacteria, UA RARE (*)    All other components within normal limits  CBC WITH DIFFERENTIAL/PLATELET  HCG, QUANTITATIVE, PREGNANCY  I-STAT BETA HCG BLOOD, ED ([facility], [facility], [facility] ONLY)    EKG None  Radiology CT ABDOMEN PELVIS W CONTRAST  Result Date: 05/20/2023 CLINICAL DATA:  Acute abdominal pain, lower suprapubic EXAM: CT ABDOMEN AND PELVIS WITH CONTRAST  TECHNIQUE: Multidetector CT imaging of the abdomen and pelvis was performed using the standard protocol following bolus administration of intravenous contrast. RADIATION DOSE REDUCTION: This exam was performed according to the departmental dose-optimization program which includes automated exposure control, adjustment of the mA and/or kV according to patient size and/or use of iterative reconstruction technique. CONTRAST:  OMNIPAQUE IOHEXOL 300 MG/ML  SOLN COMPARISON:  None Available. FINDINGS: Lower chest: No pleural or pericardial effusion. Hepatobiliary: No focal liver abnormality is seen. Status post cholecystectomy. No biliary dilatation. Pancreas: Unremarkable. No pancreatic ductal dilatation or surrounding inflammatory changes. Spleen: Normal in size without focal abnormality. Adrenals/Urinary Tract: Adrenal glands are unremarkable. Kidneys are normal, without renal calculi, focal lesion, or hydronephrosis. Bladder is unremarkable. Stomach/Bowel: Stomach is incompletely distended. Small bowel decompressed. Post appendectomy. Colon is incompletely distended. Multiple descending and sigmoid diverticula. Wall thickening in the mid sigmoid colon with regional inflammatory/edematous changes, no discrete abscess. Vascular/Lymphatic: No vascular pathology. Retroaortic left renal vein, anatomic variant. No adenopathy. Reproductive: Uterus and bilateral adnexa are unremarkable. Other: No ascites.  No free air. Musculoskeletal: Osteitis pubis.  No acute findings. IMPRESSION: Sigmoid diverticulitis without abscess. Electronically Signed   By: Corlis Leak M.D.   On: 05/20/2023 11:28    Procedures Procedures  {Document cardiac monitor, telemetry assessment procedure when appropriate:1}  Medications Ordered in ED Medications  iohexol (OMNIPAQUE) 300 MG/ML solution 100 mL (100 mLs Intravenous Contrast Given 05/20/23 1103)    ED Course/ Medical Decision Making/ A&P   {   Click here for ABCD2, HEART and  other calculatorsREFRESH Note before signing :1}                          Medical Decision Making Amount and/or Complexity of Data Reviewed Labs: ordered. Radiology: ordered.  Risk Prescription drug management.   Patient has diverticulitis send she will be sent home on Cipro and Flagyl and follow-up with her doctor  {Document critical care time when appropriate:1} {Document review of labs and clinical decision tools ie heart score, Chads2Vasc2 etc:1}  {Document your independent review of radiology images, and any outside records:1} {Document your discussion with family members, caretakers, and with consultants:1} {Document social determinants of health affecting pt's care:1} {Document your decision making why or why not admission, treatments were needed:1} Final Clinical Impression(s) / ED Diagnoses Final diagnoses:  Diverticulitis    Rx / DC Orders ED Discharge Orders          Ordered    ciprofloxacin (CIPRO) 500 MG tablet  2 times daily        05/20/23 1211    metroNIDAZOLE (FLAGYL) 500 MG tablet  4 times daily        05/20/23 1211    HYDROcodone-acetaminophen (NORCO/VICODIN) 5-325 MG tablet  Status:  Discontinued  05/20/23 1211    ondansetron (ZOFRAN-ODT) 4 MG disintegrating tablet        05/20/23 1211    HYDROcodone-acetaminophen (NORCO/VICODIN) 5-325 MG tablet  Status:  Discontinued        05/20/23 1211    HYDROcodone-acetaminophen (NORCO/VICODIN) 5-325 MG tablet        05/20/23 1213

## 2023-05-20 NOTE — Discharge Instructions (Signed)
Follow-up as planned with your doctor next week.  Return sooner if problems

## 2023-05-28 DIAGNOSIS — E782 Mixed hyperlipidemia: Secondary | ICD-10-CM | POA: Diagnosis not present

## 2023-05-28 DIAGNOSIS — E1169 Type 2 diabetes mellitus with other specified complication: Secondary | ICD-10-CM | POA: Diagnosis not present

## 2023-05-28 DIAGNOSIS — I509 Heart failure, unspecified: Secondary | ICD-10-CM | POA: Diagnosis not present

## 2023-05-28 DIAGNOSIS — I110 Hypertensive heart disease with heart failure: Secondary | ICD-10-CM | POA: Diagnosis not present

## 2023-05-28 DIAGNOSIS — I5033 Acute on chronic diastolic (congestive) heart failure: Secondary | ICD-10-CM | POA: Diagnosis not present

## 2023-05-28 DIAGNOSIS — R000 Tachycardia, unspecified: Secondary | ICD-10-CM | POA: Diagnosis not present

## 2023-08-26 DIAGNOSIS — K5792 Diverticulitis of intestine, part unspecified, without perforation or abscess without bleeding: Secondary | ICD-10-CM | POA: Diagnosis not present

## 2023-08-26 DIAGNOSIS — Z79899 Other long term (current) drug therapy: Secondary | ICD-10-CM | POA: Diagnosis not present

## 2023-08-28 ENCOUNTER — Other Ambulatory Visit: Payer: Self-pay | Admitting: Student

## 2023-09-16 DIAGNOSIS — E041 Nontoxic single thyroid nodule: Secondary | ICD-10-CM | POA: Diagnosis not present

## 2023-09-16 DIAGNOSIS — E1169 Type 2 diabetes mellitus with other specified complication: Secondary | ICD-10-CM | POA: Diagnosis not present

## 2023-09-16 DIAGNOSIS — E782 Mixed hyperlipidemia: Secondary | ICD-10-CM | POA: Diagnosis not present

## 2023-09-22 ENCOUNTER — Encounter: Payer: Self-pay | Admitting: Internal Medicine

## 2023-09-22 DIAGNOSIS — E1169 Type 2 diabetes mellitus with other specified complication: Secondary | ICD-10-CM | POA: Diagnosis not present

## 2023-09-22 DIAGNOSIS — I5033 Acute on chronic diastolic (congestive) heart failure: Secondary | ICD-10-CM | POA: Diagnosis not present

## 2023-09-22 DIAGNOSIS — E782 Mixed hyperlipidemia: Secondary | ICD-10-CM | POA: Diagnosis not present

## 2023-09-22 DIAGNOSIS — R000 Tachycardia, unspecified: Secondary | ICD-10-CM | POA: Diagnosis not present

## 2023-09-22 DIAGNOSIS — I110 Hypertensive heart disease with heart failure: Secondary | ICD-10-CM | POA: Diagnosis not present

## 2023-11-11 ENCOUNTER — Ambulatory Visit: Payer: BC Managed Care – PPO | Attending: Cardiology | Admitting: Cardiology

## 2023-11-11 ENCOUNTER — Encounter: Payer: Self-pay | Admitting: Cardiology

## 2023-11-11 VITALS — BP 116/76 | HR 84 | Ht 63.0 in | Wt 228.0 lb

## 2023-11-11 DIAGNOSIS — I5032 Chronic diastolic (congestive) heart failure: Secondary | ICD-10-CM | POA: Diagnosis not present

## 2023-11-11 DIAGNOSIS — I493 Ventricular premature depolarization: Secondary | ICD-10-CM

## 2023-11-11 DIAGNOSIS — I471 Supraventricular tachycardia, unspecified: Secondary | ICD-10-CM

## 2023-11-11 DIAGNOSIS — E782 Mixed hyperlipidemia: Secondary | ICD-10-CM | POA: Diagnosis not present

## 2023-11-11 NOTE — Patient Instructions (Signed)

## 2023-11-11 NOTE — Progress Notes (Signed)
Clinical Summary Margaret [name] is a 47 y.o.female seen today for follow up of the following medical problems.    1. Chronic systolic HF - several month history of progressing SOB/DOE, cough, orthopnea. No significant LE edema. Has had some adbomdinal distension     - Jan 2020 echo ordered by pcp showed LVEF 15-20%, restricitive diastolic function, mild RV dysfunction.  - 01/2019 cath: no significant CAD. Mean PA 21, PCWP 11, CI 2.85 - cardiac MRI no definitive evidence of myocarditis or infiltrating disease  03/2019 holter: 5.6% PVC burden     - no EtOH, no drug use.  - Father with MI in early 62s, no other family history of heart disease. Maternal grandfather early 5s had MI. No significant FH of heart failure per her report.     06/2019 echo LVEF 35-40%  02/2020 echo LVEF 45-50%, normal diastolic function - during 02/2020 ER visit noted some lip swelling, entresto was stopped. Tried losartan but also had some lip swelling.  Jan 2024 LVEF 50%  - no SOB/DOE, no LE edema - compliant with meds      2. PSVT - s/p ablation Jan 2017 by Dr Ladona Ridgel  EKG today shows NSR - no recent palpitations     3. PVCs - noted on prior EKGs 03/2019 holter: 5.6% PVC burden   -no significant palpitatoins.      4.Hyperlipidemia - taking atorva 10mg  MWF - reports joint pains.  - stopped atorva about 1 month ago, symptoms resolved  08/2023 TC 190 TG 150 HDL 52 LDL 112 - based on this panel crestor increased from 5mg  MWF to daily by pcp, tolerating thus far.    5. DM2  - mild, A1c was 6.5 in 2022 - on ozempic, last A1c 5.9 Past Medical History:  Diagnosis Date   Diabetes mellitus without complication (HCC)    GERD (gastroesophageal reflux disease)    Hypercholesteremia    NICM (nonischemic cardiomyopathy) (HCC)    a. EF 15-20% by echo in 12/2018 with cath showing normal cors and no evidence of myocarditis or infiltrating disease by cardiac MRI b. EF at 45-50% by echo in 02/2020    Pneumonia 12/2021   SVT (supraventricular tachycardia)      Allergies  Allergen Reactions   Entresto [Sacubitril-Valsartan]     LIP SWELLING    Codeine Hives and Itching   Losartan Swelling    Facial swelling   Sulfa Antibiotics Hives     Current Outpatient Medications  Medication Sig Dispense Refill   Blood Glucose Monitoring Suppl (CONTOUR NEXT MONITOR) w/Device KIT USE TO CHECK BLOOD SUGAR PER PACKAGE INSTRUCTIONS     carvedilol (COREG) 6.25 MG tablet TAKE 1 TABLET BY MOUTH IN THE  MORNING AND 2 TABLETS BY MOUTH  IN THE EVENING 270 tablet 3   ciprofloxacin (CIPRO) 500 MG tablet Take 1 tablet (500 mg total) by mouth 2 (two) times daily. One po bid x 7 days 20 tablet 0   CONTOUR NEXT TEST test strip 2 (two) times daily.     fluticasone (FLONASE) 50 MCG/ACT nasal spray Place 1 spray into both nostrils daily as needed for allergies.     furosemide (LASIX) 40 MG tablet TAKE 1 TABLET BY MOUTH DAILY.  MAY TAKE AN ADDITIONAL 1 TABLET  DAILY AS NEEDED FOR SHORTNESS OF BREATH OR SWELLING 180 tablet 3   HYDROcodone-acetaminophen (NORCO/VICODIN) 5-325 MG tablet Take 1 every 6 hours as needed for pain Relieved by Tylenol or Motrin alone 20  tablet 0   metroNIDAZOLE (FLAGYL) 500 MG tablet Take 1 tablet (500 mg total) by mouth 4 (four) times daily. 40 tablet 0   Microlet Lancets MISC USE TO CHECK BLOOD SUGAR TWICE A DAY     nitrofurantoin, macrocrystal-monohydrate, (MACROBID) 100 MG capsule Take 1 capsule (100 mg total) by mouth 2 (two) times daily. 10 capsule 0   ondansetron (ZOFRAN-ODT) 4 MG disintegrating tablet 4mg  ODT q4 hours prn nausea/vomit 10 tablet 0   OZEMPIC, 0.25 OR 0.5 MG/DOSE, 2 MG/3ML SOPN Inject 0.5 mg into the skin once a week.     potassium chloride SA (KLOR-CON M) 20 MEQ tablet TAKE 1 TABLET BY MOUTH DAILY 90 tablet 3   rosuvastatin (CRESTOR) 5 MG tablet Take 1 tablet (5 mg total) by mouth every Monday, Wednesday, and Friday. 30 tablet 1   spironolactone (ALDACTONE) 25 MG  tablet TAKE ONE-HALF TABLET BY MOUTH  DAILY 45 tablet 3   No current facility-administered medications for this visit.     Past Surgical History:  Procedure Laterality Date   APPENDECTOMY  1981   CESAREAN SECTION  1999; 2007   COLONOSCOPY WITH PROPOFOL N/A 02/14/2022   Procedure: COLONOSCOPY WITH PROPOFOL;  Surgeon: Dolores Frame, MD;  Location: [facility];  Service: Gastroenterology;  Laterality: N/A;  145   ELECTROPHYSIOLOGIC STUDY N/A 01/09/2016   Procedure: SVT Ablation;  Surgeon: Marinus Maw, MD;  Location: [facility] INVASIVE CV LAB;  Service: Cardiovascular;  Laterality: N/A;   LAPAROSCOPIC CHOLECYSTECTOMY  1998   MICROLARYNGOSCOPY Left 04/26/2018   Procedure: MICROLARYNGOSCOPY WITH EXCISION OF VALLECULER MASS;  Surgeon: Newman Pies, MD;  Location: [facility];  Service: ENT;  Laterality: Left;   RIGHT/LEFT HEART CATH AND CORONARY ANGIOGRAPHY N/A 02/09/2019   Procedure: RIGHT/LEFT HEART CATH AND CORONARY ANGIOGRAPHY;  Surgeon: Swaz[place], Peter M, MD;  Location: [facility] INVASIVE CV LAB;  Service: Cardiovascular;  Laterality: N/A;   SUPRAVENTRICULAR TACHYCARDIA ABLATION  01/09/2016   TUBAL LIGATION  2007     Allergies  Allergen Reactions   Entresto [Sacubitril-Valsartan]     LIP SWELLING    Codeine Hives and Itching   Losartan Swelling    Facial swelling   Sulfa Antibiotics Hives      Family History  Problem Relation Age of Onset   Hypertension Mother    Heart failure Father    Hypertension Father    Stroke Father    Colon cancer Neg Hx      Social History Margaret [name] reports that she has never smoked. She has never used smokeless tobacco. Margaret [name] reports no history of alcohol use.   Review of Systems CONSTITUTIONAL: No weight loss, fever, chills, weakness or fatigue.  HEENT: Eyes: No visual loss, blurred vision, double vision or yellow sclerae.No hearing loss, sneezing, congestion, runny nose or sore throat.  SKIN: No rash or itching.   CARDIOVASCULAR: per hpi RESPIRATORY: No shortness of breath, cough or sputum.  GASTROINTESTINAL: No anorexia, nausea, vomiting or diarrhea. No abdominal pain or blood.  GENITOURINARY: No burning on urination, no polyuria NEUROLOGICAL: No headache, dizziness, syncope, paralysis, ataxia, numbness or tingling in the extremities. No change in bowel or bladder control.  MUSCULOSKELETAL: No muscle, back pain, joint pain or stiffness.  LYMPHATICS: No enlarged nodes. No history of splenectomy.  PSYCHIATRIC: No history of depression or anxiety.  ENDOCRINOLOGIC: No reports of sweating, cold or heat intolerance. No polyuria or polydipsia.  Marland Kitchen   Physical Examination Today's Vitals   11/11/23 0905  BP: 116/76  Pulse: 84  SpO2: 96%  Weight: 228 lb (103.4 kg)  Height: 5\' 3"  (1.6 m)   Body mass index is 40.39 kg/m.  Gen: resting comfortably, no acute distress HEENT: no scleral icterus, pupils equal round and reactive, no palptable cervical adenopathy,  CV: RRR, no m/rg, no jvd Resp: Clear to auscultation bilaterally GI: abdomen is soft, non-tender, non-distended, normal bowel sounds, no hepatosplenomegaly MSK: extremities are warm, no edema.  Skin: warm, no rash Neuro:  no focal deficits Psych: appropriate affect   Diagnostic Studies Jan 2020 echo IMPRESSIONS      1. The left ventricle has severely reduced systolic function, LVEF 15-20%. The cavity size is severely increased. There is normal left ventricular hypertrophy. Echo evidence of restrictive diastolic filling patterns. Elevated left atrial and left  ventricular end-diastolic pressures.  2. Severe diffuse hypokinesis.  3. The right ventricle is in size. There is mildly reduced systolic function. Right ventricular systolic pressure is mildly elevated with an estimated pressure of 28.7 mmHg.  4. Severely dilated left atrial size.  5. The mitral valve normal in structure. Regurgitation is mild by color flow Doppler.  6. Tricuspid  regurgitation is mild.  7. The aortic valve tricuspid. Aortic valve regurgitation is mild by color flow Doppler.     01/2019 cath There is severe left ventricular systolic dysfunction. LV end diastolic pressure is normal. The left ventricular ejection fraction is less than 25% by visual estimate.   1. Normal coronary anatomy. Left dominant circulation 2. Severe global LV dysfunction. EF estimated at 15-20%.  3. Normal LV filling pressures 4. Normal right heart pressures 5. Preserved cardiac output. Index 2.85 L/min/m squared.     03/2019 holter Min HR 57, Max HR 145, Avg HR 92 No supraventricular ectopy Occasional ventricular ectopy (5.6%), all as isolated PVCs and bigeminy/trigeminy No significant arrhythmias       06/2019 echo IMPRESSIONS      1. The left ventricle has moderately reduced systolic function, with an ejection fraction of 35-40%. The cavity size was moderate to severely dilated. Left ventricular diastolic Doppler parameters are consistent with impaired relaxation. Left  ventricular diffuse hypokinesis.  2. The mitral valve is grossly normal. Mild thickening of the mitral valve leaflet.  3. The tricuspid valve is grossly normal.  4. The aortic valve is tricuspid. Aortic valve regurgitation is mild by color flow Doppler.  5. The aortic root is normal in size and structure.     02/2020 echo IMPRESSIONS     1. Left ventricular ejection fraction, by estimation, is 45 to 50%. The  left ventricle has mildly decreased function. The left ventricle has no  regional wall motion abnormalities. Left ventricular diastolic parameters  were normal.   2. Right ventricular systolic function is normal. The right ventricular  size is normal. There is normal pulmonary artery systolic pressure.   3. The mitral valve is normal in structure and function. Trivial mitral  valve regurgitation. No evidence of mitral stenosis.   4. The aortic valve has an indeterminant number of cusps.  Aortic valve  regurgitation is mild. No aortic stenosis is present.   5. The inferior vena cava is normal in size with greater than 50%  respiratory variability, suggesting right atrial pressure of 3 mmHg.   FINDINGS   Left Ventricle: Left ventricular ejection fraction, by estimation, is 45  to 50%. The left ventricle has mildly decreased function. The left  ventricle has no regional wall motion abnormalities. The left ventricular  internal cavity size  was normal in  size. There is no left ventricular hypertrophy. Left ventricular diastolic  parameters were normal.   Right Ventricle: The right ventricular size is normal. No increase in  right ventricular wall thickness. Right ventricular systolic function is  normal. There is normal pulmonary artery systolic pressure. The tricuspid  regurgitant velocity is 1.92 m/s, and   with an assumed right atrial pressure of 10 mmHg, the estimated right  ventricular systolic pressure is 24.7 mmHg.       Jan 2024 echo 1. Left ventricular ejection fraction, by estimation, is 50%. The left  ventricle has low normal function. The left ventricle has no regional wall  motion abnormalities. Left ventricular diastolic parameters are consistent  with Grade I diastolic  dysfunction (impaired relaxation). The average left ventricular global  longitudinal strain is -18.5 %. The global longitudinal strain is normal.   2. Right ventricular systolic function is normal. The right ventricular  size is normal. Tricuspid regurgitation signal is inadequate for assessing  PA pressure.   3. The mitral valve is normal in structure. Trivial mitral valve  regurgitation. No evidence of mitral stenosis.   4. The tricuspid valve is abnormal.   5. The aortic valve is tricuspid. There is mild calcification of the  aortic valve. There is mild thickening of the aortic valve. Aortic valve  regurgitation is trivial. No aortic stenosis is present.   6. The inferior vena cava  is normal in size with greater than 50%  respiratory variability, suggesting right atrial pressure of 3 mmHg.       Assessment and Plan   1. HFimpEF - no clear etiology by cath, MRI, or holter - most recent echo LVEF 50% - side effects on higher doses of coreg, tolerates 6.25mg  bid. She is on aldactoe - lip swelling on entresto and losartan.  - continues to do well, we will continue current meds     2. PSVT - no symptoms, continue current meds - EKG today shows NSR   3. PVCs - no recent symptms, contitinue coreg.    4. Hyperlipidemia - muscle aches on atorva  - tolerating crestor 5mg , pcp just increased to daily in September/ Flu upcoming labs   F/u 6 months       Antoine Poche, M.D.

## 2024-01-01 DIAGNOSIS — E559 Vitamin D deficiency, unspecified: Secondary | ICD-10-CM | POA: Diagnosis not present

## 2024-01-01 DIAGNOSIS — E041 Nontoxic single thyroid nodule: Secondary | ICD-10-CM | POA: Diagnosis not present

## 2024-01-01 DIAGNOSIS — E1169 Type 2 diabetes mellitus with other specified complication: Secondary | ICD-10-CM | POA: Diagnosis not present

## 2024-01-01 DIAGNOSIS — E782 Mixed hyperlipidemia: Secondary | ICD-10-CM | POA: Diagnosis not present

## 2024-01-06 DIAGNOSIS — I471 Supraventricular tachycardia, unspecified: Secondary | ICD-10-CM | POA: Diagnosis not present

## 2024-01-06 DIAGNOSIS — I5033 Acute on chronic diastolic (congestive) heart failure: Secondary | ICD-10-CM | POA: Diagnosis not present

## 2024-01-06 DIAGNOSIS — E782 Mixed hyperlipidemia: Secondary | ICD-10-CM | POA: Diagnosis not present

## 2024-01-06 DIAGNOSIS — E1169 Type 2 diabetes mellitus with other specified complication: Secondary | ICD-10-CM | POA: Diagnosis not present

## 2024-01-06 DIAGNOSIS — I110 Hypertensive heart disease with heart failure: Secondary | ICD-10-CM | POA: Diagnosis not present

## 2024-02-14 ENCOUNTER — Other Ambulatory Visit: Payer: Self-pay | Admitting: Nurse Practitioner

## 2024-06-28 DIAGNOSIS — E559 Vitamin D deficiency, unspecified: Secondary | ICD-10-CM | POA: Diagnosis not present

## 2024-06-28 DIAGNOSIS — E1169 Type 2 diabetes mellitus with other specified complication: Secondary | ICD-10-CM | POA: Diagnosis not present

## 2024-06-28 DIAGNOSIS — E782 Mixed hyperlipidemia: Secondary | ICD-10-CM | POA: Diagnosis not present

## 2024-06-28 DIAGNOSIS — E041 Nontoxic single thyroid nodule: Secondary | ICD-10-CM | POA: Diagnosis not present

## 2024-07-07 DIAGNOSIS — E1169 Type 2 diabetes mellitus with other specified complication: Secondary | ICD-10-CM | POA: Diagnosis not present

## 2024-07-07 DIAGNOSIS — Z0001 Encounter for general adult medical examination with abnormal findings: Secondary | ICD-10-CM | POA: Diagnosis not present

## 2024-07-07 DIAGNOSIS — E782 Mixed hyperlipidemia: Secondary | ICD-10-CM | POA: Diagnosis not present

## 2024-07-07 DIAGNOSIS — I471 Supraventricular tachycardia, unspecified: Secondary | ICD-10-CM | POA: Diagnosis not present

## 2024-07-07 DIAGNOSIS — I5033 Acute on chronic diastolic (congestive) heart failure: Secondary | ICD-10-CM | POA: Diagnosis not present

## 2024-07-16 ENCOUNTER — Other Ambulatory Visit: Payer: Self-pay | Admitting: Cardiology

## 2024-09-05 DIAGNOSIS — H524 Presbyopia: Secondary | ICD-10-CM | POA: Diagnosis not present

## 2024-09-30 ENCOUNTER — Ambulatory Visit: Admitting: Cardiology

## 2024-10-31 DIAGNOSIS — K5792 Diverticulitis of intestine, part unspecified, without perforation or abscess without bleeding: Secondary | ICD-10-CM | POA: Diagnosis not present

## 2024-10-31 DIAGNOSIS — Z713 Dietary counseling and surveillance: Secondary | ICD-10-CM | POA: Diagnosis not present

## 2024-10-31 DIAGNOSIS — Z6841 Body Mass Index (BMI) 40.0 and over, adult: Secondary | ICD-10-CM | POA: Diagnosis not present

## 2024-11-23 ENCOUNTER — Ambulatory Visit: Admitting: Cardiology

## 2025-01-02 ENCOUNTER — Other Ambulatory Visit: Payer: Self-pay | Admitting: Cardiology

## 2025-01-02 ENCOUNTER — Other Ambulatory Visit: Payer: Self-pay | Admitting: Nurse Practitioner

## 2025-02-02 ENCOUNTER — Ambulatory Visit: Admitting: Cardiology

## 2025-04-17 ENCOUNTER — Ambulatory Visit: Admitting: Cardiology
